# Patient Record
Sex: Male | Born: 1960 | Race: Black or African American | Hispanic: No | Marital: Married | Smoking: Current every day smoker
Health system: Southern US, Community
[De-identification: ages and names within clinical notes are randomized; demographics above are authoritative.]

## PROBLEM LIST (undated history)

## (undated) DIAGNOSIS — I10 Essential (primary) hypertension: Secondary | ICD-10-CM

## (undated) DIAGNOSIS — E213 Hyperparathyroidism, unspecified: Secondary | ICD-10-CM

## (undated) HISTORY — PX: OTHER SURGICAL HISTORY: SHX169

---

## 2005-11-18 ENCOUNTER — Inpatient Hospital Stay (HOSPITAL_COMMUNITY): Admission: AC | Admit: 2005-11-18 | Discharge: 2005-11-18 | Payer: Self-pay

## 2015-12-03 ENCOUNTER — Other Ambulatory Visit (HOSPITAL_COMMUNITY): Payer: Self-pay | Admitting: Family Medicine

## 2015-12-03 DIAGNOSIS — R7989 Other specified abnormal findings of blood chemistry: Secondary | ICD-10-CM

## 2015-12-17 ENCOUNTER — Encounter (HOSPITAL_COMMUNITY)
Admission: RE | Admit: 2015-12-17 | Discharge: 2015-12-17 | Disposition: A | Discharge status: Home / Self Care | Payer: Managed Care, Other (non HMO) | Source: Ambulatory Visit | Attending: Family Medicine | Admitting: Family Medicine

## 2015-12-17 DIAGNOSIS — E215 Disorder of parathyroid gland, unspecified: Secondary | ICD-10-CM | POA: Diagnosis not present

## 2015-12-17 DIAGNOSIS — R7989 Other specified abnormal findings of blood chemistry: Secondary | ICD-10-CM

## 2015-12-17 MED ORDER — TECHNETIUM TC 99M SESTAMIBI GENERIC - CARDIOLITE
25.0000 | Freq: Once | INTRAVENOUS | Status: AC | PRN
  Administered 2015-12-17: 25 via INTRAVENOUS

## 2016-01-13 ENCOUNTER — Ambulatory Visit: Payer: Self-pay | Admitting: Surgery

## 2016-02-02 ENCOUNTER — Encounter: Payer: Self-pay | Admitting: Surgery

## 2016-02-02 DIAGNOSIS — E210 Primary hyperparathyroidism: Secondary | ICD-10-CM | POA: Diagnosis present

## 2016-02-02 NOTE — Patient Instructions (Addendum)
Eluterio E Lantzy  02/02/2016   Your procedure is scheduled on: 02/05/2016  Report to [facility] [address] [facility]  Entrance take [facility]  elevators to 3rd floor to  [place] at 1000AM.  Call this number if you have problems the morning of surgery [phone]   Remember: ONLY 1 PERSON MAY GO WITH YOU TO SHORT STAY TO GET  READY MORNING OF YOUR SURGERY.  Do not eat food or drink liquids :After Midnight.     Take these medicines the morning of surgery with A SIP OF WATER: Clonidine(Catapres), Doxazosin(Cardura), Amlodipine(Norvasc), Pravastatin(Pravachol), metoprolol succinate                                You may not have any metal on your body including hair pins and              piercings  Do not wear jewelry, make-up, lotions, powders or perfumes, deodorant             Do not wear nail polish.  Do not shave  48 hours prior to surgery.              Men may shave face and neck.   Do not bring valuables to the hospital. [place].  Contacts, dentures or bridgework may not be worn into surgery.  Leave suitcase in the car. After surgery it may be brought to your room.     Patients discharged the [place] of surgery will not be allowed to drive home.  Name and phone number of your driver:wife cynthia cell [phone]  Special Instructions: N/A              Please read over the following fact sheets you were given: _____________________________________________________________________             [facility] - Preparing for Surgery Before surgery, you can play an important role.  Because skin is not sterile, your skin needs to be as free of germs as possible.  You can reduce the number of germs on your skin by washing with CHG (chlorahexidine gluconate) soap before surgery.  CHG is an antiseptic cleaner which kills germs and bonds with the skin to continue killing germs even after washing. Please DO NOT use if you have an allergy  to CHG or antibacterial soaps.  If your skin becomes reddened/irritated stop using the CHG and inform your nurse when you arrive at Short Stay. Do not shave (including legs and underarms) for at least 48 hours prior to the first CHG shower.  You may shave your face/neck. Please follow these instructions carefully:  1.  Shower with CHG Soap the night before surgery and the  morning of Surgery.  2.  If you choose to wash your hair, wash your hair first as usual with your  normal  shampoo.  3.  After you shampoo, rinse your hair and body thoroughly to remove the  shampoo.                           4.  Use CHG as you would any other liquid soap.  You can apply chg directly  to the skin and wash  Gently with a scrungie or clean washcloth.  5.  Apply the CHG Soap to your body ONLY FROM THE NECK DOWN.   Do not use on face/ open                           Wound or open sores. Avoid contact with eyes, ears mouth and genitals (private parts).                       Wash face,  Genitals (private parts) with your normal soap.             6.  Wash thoroughly, paying special attention to the area where your surgery  will be performed.  7.  Thoroughly rinse your body with warm water from the neck down.  8.  DO NOT shower/wash with your normal soap after using and rinsing off  the CHG Soap.                9.  Pat yourself dry with a clean towel.            10.  Wear clean pajamas.            11.  Place clean sheets on your bed the night of your first shower and do not  sleep with pets. [place] [facility] : Do not apply any lotions/deodorants the morning of surgery.  Please wear clean clothes to the hospital/surgery center.  FAILURE TO FOLLOW THESE INSTRUCTIONS MAY RESULT IN THE CANCELLATION OF YOUR SURGERY PATIENT SIGNATURE_________________________________  NURSE SIGNATURE__________________________________  ________________________________________________________________________

## 2016-02-02 NOTE — H&P (Signed)
General Surgery [facility]  Vincent Adams DOB: 17-May-1960 Married / Language: English / Race: Black or African American Male  History of Present Illness  The patient is a 56 year old male who presents with a parathyroid neoplasm.  And is referred by Dr. Hulan Fess for evaluation of primary hyperparathyroidism. Patient had been noted over the past few years to have rising serum calcium levels. Most recent levels were elevated at 13.9 with an intact PTH level of 221. Patient denies any complications such as osteoporosis or nephrolithiasis. He does note moderate fatigue. He has had no prior history on the head or neck. There is no family history of endocrine disease and no family history of endocrine neoplasms. Patient underwent nuclear medicine parathyroid scan on December 17, 2015. 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 Patient now presents to discuss surgical resection.   Past Surgical History Colon Polyp Removal - Colonoscopy   Diagnostic Studies History Colonoscopy  5-10 years ago  Allergies  No Known Drug Allergies 01/13/2016  Medication History AmLODIPine Besylate (10MG  Tablet, Oral daily) Active. CloNIDine HCl (0.1MG  Tablet, Oral daily) Active. Doxazosin Mesylate (8MG  Tablet, Oral daily) Active. HydroCHLOROthiazide (25MG  Tablet, Oral daily) Active. Klor-Con M10 (10MEQ Tablet ER, Oral daily) Active. Metoprolol Succinate ER (100MG  Tablet ER 24HR, Oral daily) Active. Potassium Chloride ER (10MEQ Tablet ER, Oral daily) Active. Pravastatin Sodium (40MG  Tablet, Oral daily) Active. Quinapril HCl (40MG  Tablet, Oral daily) Active. Vitamin D (Ergocalciferol) (50000UNIT Capsule, Oral daily) Active. Medications Reconciled  Social History  Alcohol use  Moderate alcohol use. Caffeine use  Carbonated beverages, Tea.  Family History Hypertension  Brother, Father, Mother, Sister.  Other  Problems No pertinent past medical history   Review of Systems General Not Present- Appetite Loss, Chills, Fatigue, Fever, Night Sweats, Weight Gain and Weight Loss. Skin Not Present- Change in Wart/Mole, Dryness, Hives, Jaundice, New Lesions, Non-Healing Wounds, Rash and Ulcer. HEENT Present- Wears glasses/contact lenses. Not Present- Earache, Hearing Loss, Hoarseness, Nose Bleed, Oral Ulcers, Ringing in the Ears, Seasonal Allergies, Sinus Pain, Sore Throat, Visual Disturbances and Yellow Eyes. Respiratory Not Present- Bloody sputum, Chronic Cough, Difficulty Breathing, Snoring and Wheezing. Breast Not Present- Breast Mass, Breast Pain, Nipple Discharge and Skin Changes. Cardiovascular Not Present- Chest Pain, Difficulty Breathing Lying Down, Leg Cramps, Palpitations, Rapid Heart Rate, Shortness of Breath and Swelling of Extremities. Gastrointestinal Not Present- Abdominal Pain, Bloating, Bloody Stool, Change in Bowel Habits, Chronic diarrhea, Constipation, Difficulty Swallowing, Excessive gas, Gets full quickly at meals, Hemorrhoids, Indigestion, Nausea, Rectal Pain and Vomiting. Male Genitourinary Not Present- Blood in Urine, Change in Urinary Stream, Frequency, Impotence, Nocturia, Painful Urination, Urgency and Urine Leakage.  Vitals  Weight: 284.4 lb Height: 72in Body Surface Area: 2.47 m Body Mass Index: 38.57 kg/m  Temp.: 98.67F  Pulse: 89 (Regular)  BP: 124/82 (Sitting, Left Arm, Standard)  Physical Exam The physical exam findings are as follows: Note:General - appears comfortable, no distress; not diaphorectic  HEENT - normocephalic; sclerae clear, gaze conjugate; mucous membranes moist, dentition good; voice normal  Neck - symmetric on extension; no palpable anterior or posterior cervical adenopathy; no palpable masses in the thyroid bed  Chest - clear bilaterally without rhonchi, rales, or wheeze  Cor - regular rhythm with normal rate; no significant  murmur  Ext - non-tender without significant edema or lymphedema  Neuro - grossly intact; no tremor   Assessment & Plan  PRIMARY HYPERPARATHYROIDISM (E21.0)  Pt Education - Pamphlet Given - The  Parathyroid Surgery Book: discussed with patient and provided information. Patient presents on referral from his primary care physician with newly diagnosed primary hyperparathyroidism. Patient is provided with written literature on parathyroid surgery to review at home.  Patient has an elevated intact PTH level of 221 with a markedly elevated calcium level of 13.9. Nuclear medicine parathyroid scan localizes a parathyroid adenoma to the right inferior position.  I recommended proceeding with minimally invasive parathyroidectomy. There is approximately a 95% chance this will be curative. We discussed the potential for a second gland adenoma. That occurs approximately 3-4% of the time. We discussed the procedure, the potential complications, the hospital stay, and his postoperative recovery and return to work. I have recommended 1 week out of work following the procedure. We will monitor his laboratory studies after surgery.  The risks and benefits of the procedure have been discussed at length with the patient. The patient understands the proposed procedure, potential alternative treatments, and the course of recovery to be expected. All of the patient's questions have been answered at this time. The patient wishes to proceed with surgery.  Earnstine Regal, MD, [facility] Office: [phone]

## 2016-02-02 NOTE — Progress Notes (Signed)
11/17/2017Nuclear Medicine Parathyroid with spect in EPIC

## 2016-02-04 ENCOUNTER — Encounter (HOSPITAL_COMMUNITY)
Admission: RE | Admit: 2016-02-04 | Discharge: 2016-02-04 | Disposition: A | Discharge status: Home / Self Care | Payer: Managed Care, Other (non HMO) | Source: Ambulatory Visit | Attending: Surgery | Admitting: Surgery

## 2016-02-04 ENCOUNTER — Encounter (HOSPITAL_COMMUNITY): Payer: Self-pay

## 2016-02-04 DIAGNOSIS — I10 Essential (primary) hypertension: Secondary | ICD-10-CM | POA: Diagnosis not present

## 2016-02-04 DIAGNOSIS — Z7982 Long term (current) use of aspirin: Secondary | ICD-10-CM | POA: Diagnosis not present

## 2016-02-04 DIAGNOSIS — Z79899 Other long term (current) drug therapy: Secondary | ICD-10-CM | POA: Diagnosis not present

## 2016-02-04 DIAGNOSIS — F17200 Nicotine dependence, unspecified, uncomplicated: Secondary | ICD-10-CM | POA: Diagnosis not present

## 2016-02-04 DIAGNOSIS — D351 Benign neoplasm of parathyroid gland: Secondary | ICD-10-CM | POA: Diagnosis not present

## 2016-02-04 DIAGNOSIS — E210 Primary hyperparathyroidism: Secondary | ICD-10-CM | POA: Diagnosis not present

## 2016-02-04 HISTORY — DX: Hyperparathyroidism, unspecified: E21.3

## 2016-02-04 HISTORY — DX: Essential (primary) hypertension: I10

## 2016-02-04 LAB — CBC
HEMATOCRIT: 36.3 % — AB (ref 39.0–52.0)
Hemoglobin: 12.4 g/dL — ABNORMAL LOW (ref 13.0–17.0)
MCH: 30.3 pg (ref 26.0–34.0)
MCHC: 34.2 g/dL (ref 30.0–36.0)
MCV: 88.8 fL (ref 78.0–100.0)
Platelets: 214 10*3/uL (ref 150–400)
RBC: 4.09 MIL/uL — ABNORMAL LOW (ref 4.22–5.81)
RDW: 13.5 % (ref 11.5–15.5)
WBC: 6.7 10*3/uL (ref 4.0–10.5)

## 2016-02-04 LAB — BASIC METABOLIC PANEL
Anion gap: 4 — ABNORMAL LOW (ref 5–15)
BUN: 29 mg/dL — ABNORMAL HIGH (ref 6–20)
CHLORIDE: 111 mmol/L (ref 101–111)
CO2: 24 mmol/L (ref 22–32)
CREATININE: 1.3 mg/dL — AB (ref 0.61–1.24)
Calcium: 13.8 mg/dL (ref 8.9–10.3)
GFR calc non Af Amer: 59 mL/min — ABNORMAL LOW (ref >60)
Glucose, Bld: 133 mg/dL — ABNORMAL HIGH (ref 65–99)
Potassium: 4 mmol/L (ref 3.5–5.1)
Sodium: 139 mmol/L (ref 135–145)

## 2016-02-04 MED ORDER — DEXTROSE 5 % IV SOLN
3.0000 g | INTRAVENOUS | Status: AC
  Administered 2016-02-05: 3 g via INTRAVENOUS
  Filled 2016-02-04: qty 3

## 2016-02-04 NOTE — Progress Notes (Signed)
CRITICAL VALUE ALERT  Critical value received:  Calcium 13.8  Date of notification: 02-04-2016  Time of notification:  951am  Critical value read back: yes  Nurse who received alert:  Zelphia Cairo rn  MD notified (1st page):  Pa on call for [place]  Time of first page:  0955 am  MD notified (2nd page):  Time of second page:  Responding MD:dr gerkin   Time MD responded:  1017 am no new orders received from dr gerkin

## 2016-02-05 ENCOUNTER — Encounter (HOSPITAL_COMMUNITY)
Admission: RE | Disposition: A | Discharge status: Home / Self Care | Payer: Self-pay | Source: Ambulatory Visit | Attending: Surgery

## 2016-02-05 ENCOUNTER — Ambulatory Visit (HOSPITAL_COMMUNITY)
Admission: RE | Admit: 2016-02-05 | Discharge: 2016-02-05 | Disposition: A | Discharge status: Home / Self Care | Payer: Managed Care, Other (non HMO) | Source: Ambulatory Visit | Attending: Surgery | Admitting: Surgery

## 2016-02-05 ENCOUNTER — Ambulatory Visit (HOSPITAL_COMMUNITY): Payer: Managed Care, Other (non HMO) | Admitting: Certified Registered Nurse Anesthetist

## 2016-02-05 ENCOUNTER — Encounter (HOSPITAL_COMMUNITY): Payer: Self-pay | Admitting: *Deleted

## 2016-02-05 DIAGNOSIS — I10 Essential (primary) hypertension: Secondary | ICD-10-CM | POA: Insufficient documentation

## 2016-02-05 DIAGNOSIS — E210 Primary hyperparathyroidism: Secondary | ICD-10-CM | POA: Diagnosis present

## 2016-02-05 DIAGNOSIS — Z79899 Other long term (current) drug therapy: Secondary | ICD-10-CM | POA: Insufficient documentation

## 2016-02-05 DIAGNOSIS — F17200 Nicotine dependence, unspecified, uncomplicated: Secondary | ICD-10-CM | POA: Insufficient documentation

## 2016-02-05 DIAGNOSIS — Z7982 Long term (current) use of aspirin: Secondary | ICD-10-CM | POA: Insufficient documentation

## 2016-02-05 DIAGNOSIS — D351 Benign neoplasm of parathyroid gland: Secondary | ICD-10-CM | POA: Insufficient documentation

## 2016-02-05 HISTORY — PX: PARATHYROIDECTOMY: SHX19

## 2016-02-05 SURGERY — PARATHYROIDECTOMY
Anesthesia: General | Site: Neck | Laterality: Right

## 2016-02-05 MED ORDER — ROCURONIUM BROMIDE 50 MG/5ML IV SOSY
PREFILLED_SYRINGE | INTRAVENOUS | Status: DC | PRN
  Administered 2016-02-05: 10 mg via INTRAVENOUS
  Administered 2016-02-05: 40 mg via INTRAVENOUS

## 2016-02-05 MED ORDER — HYDROCODONE-ACETAMINOPHEN 5-325 MG PO TABS
1.0000 | ORAL_TABLET | ORAL | Status: DC | PRN
  Administered 2016-02-05: 1 via ORAL
  Filled 2016-02-05: qty 1

## 2016-02-05 MED ORDER — FENTANYL CITRATE (PF) 250 MCG/5ML IJ SOLN
INTRAMUSCULAR | Status: AC
  Filled 2016-02-05: qty 5

## 2016-02-05 MED ORDER — MIDAZOLAM HCL 5 MG/5ML IJ SOLN
INTRAMUSCULAR | Status: DC | PRN
  Administered 2016-02-05: 2 mg via INTRAVENOUS

## 2016-02-05 MED ORDER — SUGAMMADEX SODIUM 200 MG/2ML IV SOLN
INTRAVENOUS | Status: DC | PRN
  Administered 2016-02-05: 300 mg via INTRAVENOUS

## 2016-02-05 MED ORDER — MEPERIDINE HCL 50 MG/ML IJ SOLN: 6.2500 mg | INTRAMUSCULAR | Status: DC | PRN

## 2016-02-05 MED ORDER — ONDANSETRON HCL 4 MG/2ML IJ SOLN
INTRAMUSCULAR | Status: DC | PRN
  Administered 2016-02-05: 4 mg via INTRAVENOUS

## 2016-02-05 MED ORDER — SUGAMMADEX SODIUM 500 MG/5ML IV SOLN
INTRAVENOUS | Status: AC
  Filled 2016-02-05: qty 5

## 2016-02-05 MED ORDER — BUPIVACAINE HCL (PF) 0.25 % IJ SOLN
INTRAMUSCULAR | Status: AC
  Filled 2016-02-05: qty 30

## 2016-02-05 MED ORDER — LACTATED RINGERS IV SOLN
INTRAVENOUS | Status: DC
  Administered 2016-02-05: 11:00:00 via INTRAVENOUS

## 2016-02-05 MED ORDER — PROPOFOL 10 MG/ML IV BOLUS
INTRAVENOUS | Status: DC | PRN
  Administered 2016-02-05: 200 mg via INTRAVENOUS

## 2016-02-05 MED ORDER — DEXAMETHASONE SODIUM PHOSPHATE 10 MG/ML IJ SOLN
INTRAMUSCULAR | Status: DC | PRN
  Administered 2016-02-05: 10 mg via INTRAVENOUS

## 2016-02-05 MED ORDER — BUPIVACAINE HCL (PF) 0.25 % IJ SOLN
INTRAMUSCULAR | Status: DC | PRN
  Administered 2016-02-05: 10 mL

## 2016-02-05 MED ORDER — FENTANYL CITRATE (PF) 100 MCG/2ML IJ SOLN
INTRAMUSCULAR | Status: DC | PRN
  Administered 2016-02-05 (×5): 50 ug via INTRAVENOUS

## 2016-02-05 MED ORDER — ROCURONIUM BROMIDE 50 MG/5ML IV SOSY
PREFILLED_SYRINGE | INTRAVENOUS | Status: AC
  Filled 2016-02-05: qty 5

## 2016-02-05 MED ORDER — CHLORHEXIDINE GLUCONATE CLOTH 2 % EX PADS: 6.0000 | MEDICATED_PAD | Freq: Once | CUTANEOUS | Status: DC

## 2016-02-05 MED ORDER — MIDAZOLAM HCL 2 MG/2ML IJ SOLN
INTRAMUSCULAR | Status: AC
  Filled 2016-02-05: qty 2

## 2016-02-05 MED ORDER — PROPOFOL 10 MG/ML IV BOLUS
INTRAVENOUS | Status: AC
  Filled 2016-02-05: qty 20

## 2016-02-05 MED ORDER — SUCCINYLCHOLINE CHLORIDE 200 MG/10ML IV SOSY
PREFILLED_SYRINGE | INTRAVENOUS | Status: AC
  Filled 2016-02-05: qty 10

## 2016-02-05 MED ORDER — ONDANSETRON HCL 4 MG/2ML IJ SOLN
INTRAMUSCULAR | Status: AC
  Filled 2016-02-05: qty 2

## 2016-02-05 MED ORDER — LIDOCAINE HCL 4 % MT SOLN
OROMUCOSAL | Status: DC | PRN
  Administered 2016-02-05: 4 mL via TOPICAL

## 2016-02-05 MED ORDER — FENTANYL CITRATE (PF) 100 MCG/2ML IJ SOLN
INTRAMUSCULAR | Status: AC
  Filled 2016-02-05: qty 2

## 2016-02-05 MED ORDER — LACTATED RINGERS IV SOLN
INTRAVENOUS | Status: DC
  Administered 2016-02-05: 15:00:00 via INTRAVENOUS

## 2016-02-05 MED ORDER — DEXAMETHASONE SODIUM PHOSPHATE 10 MG/ML IJ SOLN
INTRAMUSCULAR | Status: AC
  Filled 2016-02-05: qty 1

## 2016-02-05 MED ORDER — METOCLOPRAMIDE HCL 5 MG/ML IJ SOLN: 10.0000 mg | Freq: Once | INTRAMUSCULAR | Status: DC | PRN

## 2016-02-05 MED ORDER — SUCCINYLCHOLINE CHLORIDE 200 MG/10ML IV SOSY
PREFILLED_SYRINGE | INTRAVENOUS | Status: DC | PRN
  Administered 2016-02-05: 100 mg via INTRAVENOUS

## 2016-02-05 MED ORDER — HYDROCODONE-ACETAMINOPHEN 5-325 MG PO TABS: 1.0000 | ORAL_TABLET | ORAL | 0 refills | Status: AC | PRN

## 2016-02-05 MED ORDER — LIDOCAINE 2% (20 MG/ML) 5 ML SYRINGE
INTRAMUSCULAR | Status: AC
  Filled 2016-02-05: qty 5

## 2016-02-05 MED ORDER — 0.9 % SODIUM CHLORIDE (POUR BTL) OPTIME
TOPICAL | Status: DC | PRN
  Administered 2016-02-05: 1000 mL

## 2016-02-05 MED ORDER — FENTANYL CITRATE (PF) 100 MCG/2ML IJ SOLN
25.0000 ug | INTRAMUSCULAR | Status: DC | PRN
  Administered 2016-02-05 (×2): 50 ug via INTRAVENOUS

## 2016-02-05 SURGICAL SUPPLY — 42 items
ADH SKN CLS APL DERMABOND .7 (GAUZE/BANDAGES/DRESSINGS)
APL SKNCLS STERI-STRIP NONHPOA (GAUZE/BANDAGES/DRESSINGS)
ATTRACTOMAT 16X20 MAGNETIC DRP (DRAPES) ×2 IMPLANT
BENZOIN TINCTURE PRP APPL 2/3 (GAUZE/BANDAGES/DRESSINGS) IMPLANT
BLADE HEX COATED 2.75 (ELECTRODE) ×2 IMPLANT
BLADE SURG 15 STRL LF DISP TIS (BLADE) ×1 IMPLANT
BLADE SURG 15 STRL SS (BLADE) ×2
CHLORAPREP W/TINT 26ML (MISCELLANEOUS) ×3 IMPLANT
CLIP TI MEDIUM 6 (CLIP) ×4 IMPLANT
CLIP TI WIDE RED SMALL 6 (CLIP) ×5 IMPLANT
COVER SURGICAL LIGHT HANDLE (MISCELLANEOUS) ×2 IMPLANT
DERMABOND ADVANCED (GAUZE/BANDAGES/DRESSINGS)
DERMABOND ADVANCED .7 DNX12 (GAUZE/BANDAGES/DRESSINGS) IMPLANT
DRAPE LAPAROTOMY T 98X78 PEDS (DRAPES) ×2 IMPLANT
ELECT PENCIL ROCKER SW 15FT (MISCELLANEOUS) ×2 IMPLANT
ELECT REM PT RETURN 9FT ADLT (ELECTROSURGICAL) ×2
ELECTRODE REM PT RTRN 9FT ADLT (ELECTROSURGICAL) ×1 IMPLANT
GAUZE SPONGE 4X4 12PLY STRL (GAUZE/BANDAGES/DRESSINGS) ×1 IMPLANT
GAUZE SPONGE 4X4 16PLY XRAY LF (GAUZE/BANDAGES/DRESSINGS) ×2 IMPLANT
GLOVE SURG ORTHO 8.0 STRL STRW (GLOVE) ×2 IMPLANT
GOWN STRL REUS W/TWL XL LVL3 (GOWN DISPOSABLE) ×6 IMPLANT
HEMOSTAT SURGICEL 2X4 FIBR (HEMOSTASIS) ×2 IMPLANT
ILLUMINATOR WAVEGUIDE N/F (MISCELLANEOUS) IMPLANT
KIT BASIN OR (CUSTOM PROCEDURE TRAY) ×2 IMPLANT
LIGHT WAVEGUIDE WIDE FLAT (MISCELLANEOUS) IMPLANT
NDL HYPO 25X1 1.5 SAFETY (NEEDLE) ×1 IMPLANT
NEEDLE HYPO 25X1 1.5 SAFETY (NEEDLE) ×2 IMPLANT
PACK BASIC VI WITH GOWN DISP (CUSTOM PROCEDURE TRAY) ×2 IMPLANT
STAPLER VISISTAT 35W (STAPLE) ×1 IMPLANT
STRIP CLOSURE SKIN 1/2X4 (GAUZE/BANDAGES/DRESSINGS) ×1 IMPLANT
SUT MNCRL AB 4-0 PS2 18 (SUTURE) ×2 IMPLANT
SUT SILK 2 0 (SUTURE)
SUT SILK 2-0 18XBRD TIE 12 (SUTURE) IMPLANT
SUT SILK 3 0 (SUTURE)
SUT SILK 3-0 18XBRD TIE 12 (SUTURE) IMPLANT
SUT VIC AB 3-0 SH 18 (SUTURE) ×2 IMPLANT
SYR BULB IRRIGATION 50ML (SYRINGE) ×2 IMPLANT
SYR CONTROL 10ML LL (SYRINGE) ×2 IMPLANT
TAPE CLOTH SURG 4X10 WHT LF (GAUZE/BANDAGES/DRESSINGS) ×1 IMPLANT
TOWEL OR 17X26 10 PK STRL BLUE (TOWEL DISPOSABLE) ×2 IMPLANT
TOWEL OR NON WOVEN STRL DISP B (DISPOSABLE) ×2 IMPLANT
YANKAUER SUCT BULB TIP 10FT TU (MISCELLANEOUS) ×2 IMPLANT

## 2016-02-05 NOTE — Op Note (Signed)
OPERATIVE REPORT - PARATHYROIDECTOMY  Preoperative diagnosis: Primary hyperparathyroidism  Postop diagnosis: Same  Procedure: Right minimally invasive parathyroidectomy  Surgeon:  Earnstine Regal, MD, FACS  Anesthesia: Gen. endotracheal  Estimated blood loss: Minimal  Preparation: ChloraPrep  Indications: The patient is a 56 year old male who is referred by Dr. Hulan Fess for evaluation of primary hyperparathyroidism. Patient had been noted over the past few years to have rising serum calcium levels. Most recent levels were elevated at 13.9 with an intact PTH level of 221. Patient denies any complications such as osteoporosis or nephrolithiasis. He does note moderate fatigue. He has had no prior history on the head or neck. There is no family history of endocrine disease and no family history of endocrine neoplasms. Patient underwent nuclear medicine parathyroid scan on December 17, 2015. 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 Patient now presents to discuss surgical resection.  Procedure: Patient was prepared in the holding area. He was brought to operating room and placed in a supine position on the operating room table. Following administration of general anesthesia, the patient was positioned and then prepped and draped in the usual strict aseptic fashion. After ascertaining that an adequate level of anesthesia been achieved, a neck incision was made with a #15 blade. Dissection was carried through subcutaneous tissues and platysma. Hemostasis was obtained with the electrocautery. Skin flaps were developed circumferentially and a Weitlander retractor was placed for exposure.  Strap muscles were incised in the midline. Strap muscles were reflected exposing the thyroid lobe. With gentle blunt dissection the thyroid lobe was mobilized.  Dissection was carried through adipose tissue and a markedly enlarged parathyroid gland was identified. It  was gently mobilized. It is located relatively deep in the right neck posterior to the right thyroid lobe and extending to the pre-cervical fascia.  Vascular structures were divided between small and medium ligaclips. Care was taken to avoid the recurrent laryngeal nerve and the esophagus. The parathyroid gland was completely excised. It was submitted to pathology where frozen section confirmed parathyroid tissue consistent with adenoma.  Neck was irrigated with warm saline and good hemostasis was noted. Fibrillar was placed in the operative field. Strap muscles were reapproximated in the midline with interrupted 3-0 Vicryl sutures. Platysma was closed with interrupted 3-0 Vicryl sutures. Skin was closed with a running 4-0 Monocryl subcuticular suture. Marcaine was infiltrated circumferentially. Wound was washed and dried and Steri-Strips were applied. Sterile gauze dressings were applied. Patient was awakened from anesthesia and brought to the recovery room. The patient tolerated the procedure well.   Earnstine Regal, MD, [facility]

## 2016-02-05 NOTE — Anesthesia Preprocedure Evaluation (Signed)
Anesthesia Evaluation  Patient identified by MRN, date of birth, ID band Patient awake    Reviewed: Allergy & Precautions, NPO status , Patient's Chart, lab work & pertinent test results  Airway Mallampati: II  TM Distance: >3 FB Neck ROM: Full    Dental no notable dental hx.    Pulmonary Current Smoker,    Pulmonary exam normal breath sounds clear to auscultation       Cardiovascular hypertension, Pt. on medications Normal cardiovascular exam Rhythm:Regular Rate:Normal     Neuro/Psych negative neurological ROS  negative psych ROS   GI/Hepatic negative GI ROS, Neg liver ROS,   Endo/Other  negative endocrine ROS  Renal/GU negative Renal ROS  negative genitourinary   Musculoskeletal negative musculoskeletal ROS (+)   Abdominal   Peds negative pediatric ROS (+)  Hematology negative hematology ROS (+)   Anesthesia Other Findings   Reproductive/Obstetrics negative OB ROS                             Anesthesia Physical Anesthesia Plan  ASA: II  Anesthesia Plan: General   Post-op Pain Management:    Induction: Intravenous  Airway Management Planned: Oral ETT  Additional Equipment:   Intra-op Plan:   Post-operative Plan: Extubation in OR  Informed Consent: I have reviewed the patients History and Physical, chart, labs and discussed the procedure including the risks, benefits and alternatives for the proposed anesthesia with the patient or authorized representative who has indicated his/her understanding and acceptance.   Dental advisory given  Plan Discussed with: CRNA  Anesthesia Plan Comments:        Anesthesia Quick Evaluation

## 2016-02-05 NOTE — Anesthesia Postprocedure Evaluation (Signed)
Anesthesia Post Note  Patient: Vincent Adams  Procedure(s) Performed: Procedure(s) (LRB): RIGHT PARATHYROIDECTOMY (Right)  Patient location during evaluation: PACU Anesthesia Type: General Level of consciousness: awake and alert Pain management: pain level controlled Vital Signs Assessment: post-procedure vital signs reviewed and stable Respiratory status: spontaneous breathing, nonlabored ventilation, respiratory function stable and patient connected to nasal cannula oxygen Cardiovascular status: blood pressure returned to baseline and stable Postop Assessment: no signs of nausea or vomiting Anesthetic complications: no       Last Vitals:  Vitals:   02/05/16 1512 02/05/16 1604  BP: (!) 150/91 (!) 151/103  Pulse: 70 88  Resp: 16 16  Temp: 36.8 C 36.9 C    Last Pain:  Vitals:   02/05/16 1604  PainSc: 3                  Montez Hageman

## 2016-02-05 NOTE — Transfer of Care (Signed)
Immediate Anesthesia Transfer of Care Note  Patient: Vincent Adams  Procedure(s) Performed: Procedure(s): RIGHT PARATHYROIDECTOMY (Right)  Patient Location: PACU  Anesthesia Type:General  Level of Consciousness:  sedated, patient cooperative and responds to stimulation  Airway & Oxygen Therapy:Patient Spontanous Breathing and Patient connected to face mask oxgen  Post-op Assessment:  Report given to PACU RN and Post -op Vital signs reviewed and stable  Post vital signs:  Reviewed and stable  Last Vitals:  Vitals:   02/05/16 0959  BP: 127/89  Pulse: 80  Resp: 18  Temp: 0000000 C    Complications: No apparent anesthesia complications

## 2016-02-05 NOTE — Discharge Instructions (Signed)

## 2016-02-05 NOTE — Anesthesia Procedure Notes (Signed)
Procedure Name: Intubation Date/Time: 02/05/2016 12:25 PM Performed by: Maxwell Caul Pre-anesthesia Checklist: Patient identified, Emergency Drugs available, Suction available and Patient being monitored Patient Re-evaluated:Patient Re-evaluated prior to inductionOxygen Delivery Method: Circle system utilized Preoxygenation: Pre-oxygenation with 100% oxygen Intubation Type: IV induction Ventilation: Mask ventilation without difficulty Laryngoscope Size: Mac and 4 Grade View: Grade I Tube type: Oral Tube size: 7.5 mm Number of attempts: 1 Airway Equipment and Method: Stylet and LTA kit utilized Placement Confirmation: ETT inserted through vocal cords under direct vision,  positive ETCO2 and breath sounds checked- equal and bilateral Secured at: 21 cm Tube secured with: Tape Dental Injury: Teeth and Oropharynx as per pre-operative assessment

## 2016-02-05 NOTE — Interval H&P Note (Signed)
History and Physical Interval Note:  02/05/2016 12:09 PM  Vincent Adams  has presented today for surgery, with the diagnosis of PRIMARY HYPERPARATHYROIDISM  The various methods of treatment have been discussed with the patient and family. After consideration of risks, benefits and other options for treatment, the patient has consented to    Procedure(s): RIGHT INFERIOR PARATHYROIDECTOMY (Right) as a surgical intervention .    The patient's history has been reviewed, patient examined, no change in status, stable for surgery.  I have reviewed the patient's chart and labs.  Questions were answered to the patient's satisfaction.    Earnstine Regal, MD, [facility] Office: [place]

## 2016-02-08 ENCOUNTER — Encounter (HOSPITAL_COMMUNITY): Payer: Self-pay | Admitting: Surgery

## 2016-09-05 ENCOUNTER — Other Ambulatory Visit: Payer: Self-pay | Admitting: Family Medicine

## 2016-09-05 DIAGNOSIS — M5416 Radiculopathy, lumbar region: Secondary | ICD-10-CM

## 2016-09-17 ENCOUNTER — Ambulatory Visit
Admission: RE | Admit: 2016-09-17 | Discharge: 2016-09-17 | Disposition: A | Discharge status: Home / Self Care | Payer: Managed Care, Other (non HMO) | Source: Ambulatory Visit | Attending: Family Medicine | Admitting: Family Medicine

## 2016-09-17 DIAGNOSIS — M5416 Radiculopathy, lumbar region: Secondary | ICD-10-CM

## 2019-05-03 ENCOUNTER — Ambulatory Visit: Payer: Managed Care, Other (non HMO) | Attending: Internal Medicine

## 2019-05-03 DIAGNOSIS — Z23 Encounter for immunization: Secondary | ICD-10-CM

## 2019-05-03 NOTE — Progress Notes (Signed)
   Covid-19 Vaccination Clinic  Name:  Vincent Adams    MRN: [id number] DOB: 1961-01-04  05/03/2019  Mr. Vincent Adams was observed post Covid-19 immunization for 15 minutes without incident. He was provided with Vaccine Information Sheet and instruction to access the V-Safe system.   Mr. Vincent Adams was instructed to call 911 with any severe reactions post vaccine: Marland Kitchen Difficulty breathing  . Swelling of face and throat  . A fast heartbeat  . A bad rash all over body  . Dizziness and weakness   Immunizations Administered    Name Date Dose VIS Date Route   Pfizer COVID-19 Vaccine 05/03/2019 10:30 AM 0.3 mL 01/11/2019 Intramuscular   Manufacturer: [employer], [employer]   Lot: [id number]   [place]: [id number]

## 2019-05-29 ENCOUNTER — Ambulatory Visit: Payer: Managed Care, Other (non HMO) | Attending: Internal Medicine

## 2019-05-29 DIAGNOSIS — Z23 Encounter for immunization: Secondary | ICD-10-CM

## 2019-05-29 NOTE — Progress Notes (Signed)
   Covid-19 Vaccination Clinic  Name:  Vincent Adams    MRN: [id number] DOB: Apr 07, 1960  05/29/2019  Mr. Ferrari was observed post Covid-19 immunization for 15 minutes without incident. He was provided with Vaccine Information Sheet and instruction to access the V-Safe system.   Mr. Hurd was instructed to call 911 with any severe reactions post vaccine: Marland Kitchen Difficulty breathing  . Swelling of face and throat  . A fast heartbeat  . A bad rash all over body  . Dizziness and weakness   Immunizations Administered    Name Date Dose VIS Date Route   Pfizer COVID-19 Vaccine 05/29/2019 10:05 AM 0.3 mL 03/27/2018 Intramuscular   Manufacturer: [place]   Lot: [id number]   [place]: [id number]

## 2020-01-18 ENCOUNTER — Ambulatory Visit: Payer: Managed Care, Other (non HMO)

## 2020-10-25 ENCOUNTER — Emergency Department (HOSPITAL_COMMUNITY): Payer: Managed Care, Other (non HMO)

## 2020-10-25 ENCOUNTER — Other Ambulatory Visit: Payer: Self-pay

## 2020-10-25 ENCOUNTER — Encounter (HOSPITAL_COMMUNITY): Payer: Self-pay | Admitting: Emergency Medicine

## 2020-10-25 ENCOUNTER — Emergency Department (HOSPITAL_COMMUNITY)
Admission: EM | Admit: 2020-10-25 | Discharge: 2020-10-25 | Disposition: A | Discharge status: Home / Self Care | Payer: Managed Care, Other (non HMO) | Attending: Emergency Medicine | Admitting: Emergency Medicine

## 2020-10-25 DIAGNOSIS — Z79899 Other long term (current) drug therapy: Secondary | ICD-10-CM | POA: Diagnosis not present

## 2020-10-25 DIAGNOSIS — L03115 Cellulitis of right lower limb: Secondary | ICD-10-CM

## 2020-10-25 DIAGNOSIS — Z7982 Long term (current) use of aspirin: Secondary | ICD-10-CM | POA: Insufficient documentation

## 2020-10-25 DIAGNOSIS — R0602 Shortness of breath: Secondary | ICD-10-CM | POA: Insufficient documentation

## 2020-10-25 DIAGNOSIS — F17210 Nicotine dependence, cigarettes, uncomplicated: Secondary | ICD-10-CM | POA: Insufficient documentation

## 2020-10-25 DIAGNOSIS — R600 Localized edema: Secondary | ICD-10-CM | POA: Diagnosis not present

## 2020-10-25 DIAGNOSIS — I10 Essential (primary) hypertension: Secondary | ICD-10-CM | POA: Insufficient documentation

## 2020-10-25 DIAGNOSIS — R609 Edema, unspecified: Secondary | ICD-10-CM | POA: Diagnosis not present

## 2020-10-25 DIAGNOSIS — M79661 Pain in right lower leg: Secondary | ICD-10-CM | POA: Diagnosis present

## 2020-10-25 DIAGNOSIS — M7989 Other specified soft tissue disorders: Secondary | ICD-10-CM | POA: Insufficient documentation

## 2020-10-25 LAB — CBC WITH DIFFERENTIAL/PLATELET
Abs Immature Granulocytes: 0.17 10*3/uL — ABNORMAL HIGH (ref 0.00–0.07)
Basophils Absolute: 0.1 10*3/uL (ref 0.0–0.1)
Basophils Relative: 0 %
Eosinophils Absolute: 0 10*3/uL (ref 0.0–0.5)
Eosinophils Relative: 0 %
HCT: 37.5 % — ABNORMAL LOW (ref 39.0–52.0)
Hemoglobin: 12.7 g/dL — ABNORMAL LOW (ref 13.0–17.0)
Immature Granulocytes: 1 %
Lymphocytes Relative: 2 %
Lymphs Abs: 0.3 10*3/uL — ABNORMAL LOW (ref 0.7–4.0)
MCH: 30.5 pg (ref 26.0–34.0)
MCHC: 33.9 g/dL (ref 30.0–36.0)
MCV: 89.9 fL (ref 80.0–100.0)
Monocytes Absolute: 0.9 10*3/uL (ref 0.1–1.0)
Monocytes Relative: 5 %
Neutro Abs: 14.8 10*3/uL — ABNORMAL HIGH (ref 1.7–7.7)
Neutrophils Relative %: 92 %
Platelets: 166 10*3/uL (ref 150–400)
RBC: 4.17 MIL/uL — ABNORMAL LOW (ref 4.22–5.81)
RDW: 13.4 % (ref 11.5–15.5)
WBC: 16.2 10*3/uL — ABNORMAL HIGH (ref 4.0–10.5)
nRBC: 0 % (ref 0.0–0.2)

## 2020-10-25 LAB — BASIC METABOLIC PANEL
Anion gap: 13 (ref 5–15)
BUN: 14 mg/dL (ref 8–23)
CO2: 22 mmol/L (ref 22–32)
Calcium: 8.5 mg/dL — ABNORMAL LOW (ref 8.9–10.3)
Chloride: 101 mmol/L (ref 98–111)
Creatinine, Ser: 1.77 mg/dL — ABNORMAL HIGH (ref 0.61–1.24)
GFR, Estimated: 43 mL/min — ABNORMAL LOW (ref >60)
Glucose, Bld: 196 mg/dL — ABNORMAL HIGH (ref 70–99)
Potassium: 2.9 mmol/L — ABNORMAL LOW (ref 3.5–5.1)
Sodium: 136 mmol/L (ref 135–145)

## 2020-10-25 MED ORDER — CEPHALEXIN 500 MG PO CAPS: 500.0000 mg | ORAL_CAPSULE | Freq: Four times a day (QID) | ORAL | 0 refills | Status: AC

## 2020-10-25 MED ORDER — IOHEXOL 350 MG/ML SOLN
100.0000 mL | Freq: Once | INTRAVENOUS | Status: AC | PRN
  Administered 2020-10-25: 100 mL via INTRAVENOUS

## 2020-10-25 MED ORDER — MORPHINE SULFATE (PF) 4 MG/ML IV SOLN
4.0000 mg | Freq: Once | INTRAVENOUS | Status: AC
  Administered 2020-10-25: 4 mg via INTRAVENOUS
  Filled 2020-10-25: qty 1

## 2020-10-25 MED ORDER — SODIUM CHLORIDE 0.9 % IV BOLUS
500.0000 mL | Freq: Once | INTRAVENOUS | Status: AC
  Administered 2020-10-25: 500 mL via INTRAVENOUS

## 2020-10-25 MED ORDER — POTASSIUM CHLORIDE CRYS ER 20 MEQ PO TBCR
40.0000 meq | EXTENDED_RELEASE_TABLET | Freq: Once | ORAL | Status: AC
Start: 2020-10-25 — End: 2020-10-25
  Administered 2020-10-25: 40 meq via ORAL
  Filled 2020-10-25: qty 2

## 2020-10-25 MED ORDER — ONDANSETRON HCL 4 MG/2ML IJ SOLN
4.0000 mg | Freq: Once | INTRAMUSCULAR | Status: AC
  Administered 2020-10-25: 4 mg via INTRAVENOUS
  Filled 2020-10-25: qty 2

## 2020-10-25 MED ORDER — ACETAMINOPHEN 500 MG PO TABS
1000.0000 mg | ORAL_TABLET | Freq: Once | ORAL | Status: AC
  Administered 2020-10-25: 1000 mg via ORAL
  Filled 2020-10-25: qty 2

## 2020-10-25 NOTE — ED Triage Notes (Signed)
Pt reports increased pain and swelling to RLE x 2 days.  Denies chest pain or SOB.

## 2020-10-25 NOTE — Discharge Instructions (Addendum)
Recheck with your doctor this week.  Return to the emergency room for new or worsening symptoms.  I have prescribed you antibiotics for cellulitis.  Please take as directed.  Please drink plenty of water take antibiotics with food.  Please use Tylenol or ibuprofen for pain.  You may use 600 mg ibuprofen every 6 hours or 1000 mg of Tylenol every 6 hours.  You may choose to alternate between the 2.  This would be most effective.  Not to exceed 4 g of Tylenol within 24 hours.  Not to exceed 3200 mg ibuprofen 24 hours.

## 2020-10-25 NOTE — Progress Notes (Signed)
VASCULAR LAB    Right lower extremity venous duplex has been performed.  See CV proc for preliminary results.  Messaged results to Suella Broad, PA-C  Casson Catena, RVT 10/25/2020, 1:54 PM

## 2020-10-25 NOTE — ED Notes (Signed)
Patient transported to US 

## 2020-10-25 NOTE — ED Provider Notes (Signed)
Accepted handoff at shift change from Suella Broad PA-C. Please see prior provider note for more detail.   Briefly: "60 year old male with history of hyperparathyroidism and hypertension presents with complaint of pain in his right lower leg x2 days. Patient did some work on his house 2 days ago, that night woke up in a sweat and has had mild SHOB and pain in his right calf since that time (history provided by patient's wife at bedside, patient's only complaint is calf pain)."   PLAN: Concern for PE, DVT, cellulitis DVT study was negative   Physical Exam  BP 125/78 (BP Location: Right Arm)   Pulse 95   Temp 99.2 F (37.3 C) (Oral)   Resp 18   Ht 6' (1.829 m)   Wt 129.3 kg   SpO2 100%   BMI 38.65 kg/m   Physical Exam  ED Course/Procedures   Clinical Course as of 10/26/20 1631  Sun Oct 26, 8935  3625 60 year old male with complaint of pain in his right calf with slightly worse than baseline swelling. Wife reports sweat 2 nights ago, slight SHOB. No orthopnea.  [place] negative for DVT. CTA ordered to evaluate for PE due to leg swelling, SHOB, mildly tachycardic on arrival at 113. BMP with hypokalemia at 2.9, given oral replacement. Also Cr 1.77, not significantly changed from baseline, GFR 43, given IV fluids as he was given contrast with his chest CT. CBC with WBC elevated at 16.2 with increase in neutrophils.  [LM]  1502 Care signed out at change of shift pending CTA to evaluate for PE.  If negative, consider Keflex for possible cellulitis right lower leg. [LM]    Clinical Course User Index [LM] Tacy Learn, PA-C    Procedures Results for orders placed or performed during the hospital encounter of 19/41/74  Basic metabolic panel  Result Value Ref Range   Sodium 136 135 - 145 mmol/L   Potassium 2.9 (L) 3.5 - 5.1 mmol/L   Chloride 101 98 - 111 mmol/L   CO2 22 22 - 32 mmol/L   Glucose, Bld 196 (H) 70 - 99 mg/dL   BUN 14 8 - 23 mg/dL   Creatinine, Ser 1.77 (H) 0.61 - 1.24  mg/dL   Calcium 8.5 (L) 8.9 - 10.3 mg/dL   GFR, Estimated 43 (L) >60 mL/min   Anion gap 13 5 - 15  CBC with Differential  Result Value Ref Range   WBC 16.2 (H) 4.0 - 10.5 K/uL   RBC 4.17 (L) 4.22 - 5.81 MIL/uL   Hemoglobin 12.7 (L) 13.0 - 17.0 g/dL   HCT 37.5 (L) 39.0 - 52.0 %   MCV 89.9 80.0 - 100.0 fL   MCH 30.5 26.0 - 34.0 pg   MCHC 33.9 30.0 - 36.0 g/dL   RDW 13.4 11.5 - 15.5 %   Platelets 166 150 - 400 K/uL   nRBC 0.0 0.0 - 0.2 %   Neutrophils Relative % 92 %   Neutro Abs 14.8 (H) 1.7 - 7.7 K/uL   Lymphocytes Relative 2 %   Lymphs Abs 0.3 (L) 0.7 - 4.0 K/uL   Monocytes Relative 5 %   Monocytes Absolute 0.9 0.1 - 1.0 K/uL   Eosinophils Relative 0 %   Eosinophils Absolute 0.0 0.0 - 0.5 K/uL   Basophils Relative 0 %   Basophils Absolute 0.1 0.0 - 0.1 K/uL   Immature Granulocytes 1 %   Abs Immature Granulocytes 0.17 (H) 0.00 - 0.07 K/uL   DG Chest  2 View  Result Date: 10/25/2020 CLINICAL DATA:  sob EXAM: CHEST - 2 VIEW COMPARISON:  November 18, 2005 FINDINGS: Evaluation is limited secondary to patient rotation. The cardiomediastinal silhouette is unchanged in contour. No pleural effusion. No pneumothorax. No acute pleuroparenchymal abnormality. Visualized abdomen is unremarkable. Multilevel degenerative changes of the thoracic spine. IMPRESSION: No acute cardiopulmonary abnormality. Electronically Signed   By: Valentino Saxon M.D.   On: 10/25/2020 12:28   CT Angio Chest PE W/Cm &/Or Wo Cm  Result Date: 10/25/2020 CLINICAL DATA:  Pt reports increased pain and swelling to RLE x 2 days. Denies chest pain or SOB. PE suspected, high prob EXAM: CT ANGIOGRAPHY CHEST WITH CONTRAST TECHNIQUE: Multidetector CT imaging of the chest was performed using the standard protocol during bolus administration of intravenous contrast. Multiplanar CT image reconstructions and MIPs were obtained to evaluate the vascular anatomy. CONTRAST:  165mL OMNIPAQUE IOHEXOL 350 MG/ML SOLN COMPARISON:  Chest  x-ray 11/18/2005, CT chest 11/18/2005, NM parathyroid scintigraphy 12/17/2015 FINDINGS: Cardiovascular: Satisfactory opacification of the pulmonary arteries to the segmental level. No evidence of pulmonary embolism. The main pulmonary artery measures at the upper limits of normal. Normal heart size. No significant pericardial effusion. The thoracic aorta is normal in caliber. No atherosclerotic plaque of the thoracic aorta. At least 2 vessel coronary artery calcifications. Mediastinum/Nodes: No enlarged mediastinal, hilar, or axillary lymph nodes. Incidentally noted asymmetrically enlarged left thyroid gland compared to the right. Not clinically significant; no follow-up imaging recommended (ref: J Am Coll Radiol. 2015 Feb;12(2): 143-50). Trachea and esophagus demonstrate no significant findings. Tiny hiatal hernia. Lungs/Pleura: Bilateral lower lobe subsegmental atelectasis. No focal consolidation. No pulmonary nodule. No pulmonary mass. No pleural effusion. No pneumothorax. Upper Abdomen: Fluid density lesions within the right kidney likely represent simple renal cysts. Similar findings of the left kidney partially visualized. Subcentimeter hypodensities within the liver too small to characterize. Musculoskeletal: No chest wall abnormality. No suspicious lytic or blastic osseous lesions. No acute displaced fracture. Multilevel degenerative changes of the spine. Review of the MIP images confirms the above findings. IMPRESSION: 1. No pulmonary embolus. 2. The main pulmonary artery measures at the upper limits of normal. Findings may represent pulmonary hypertension. 3. Tiny hiatal hernia. 4. At least two vessel coronary artery calcifications. 5. Cystic lesions of kidneys likely representing simple renal cysts. Subcentimeter hypodensities within the liver that are too small to characterize. Electronically Signed   By: Iven Finn M.D.   On: 10/25/2020 15:24   VAS [place] LOWER EXTREMITY VENOUS (DVT) (ONLY [facility] & [facility]  7a-7p)  Result Date: 10/25/2020  Lower Venous DVT Study Patient Name:  Nnaemeka ERIC Goodpasture  Date of Exam:   10/25/2020 Medical Rec #: [id number]       Accession #:    [id number] Date of Birth: [birth year]      Patient Gender: M Patient Age:   52 years Exam Location:  [facility] Procedure:      VAS [place] LOWER EXTREMITY VENOUS (DVT) Referring Phys: HINA KHATRI --------------------------------------------------------------------------------  Limitations: Body habitus and severe edema of the calf. Comparison Study: No prior study Performing Technologist: Sharion Dove RVS  Examination Guidelines: A complete evaluation includes B-mode imaging, spectral Doppler, color Doppler, and power Doppler as needed of all accessible portions of each vessel. Bilateral testing is considered an integral part of a complete examination. Limited examinations for reoccurring indications may be performed as noted. The reflux portion of the exam is performed with the patient in reverse Trendelenburg.  +---------+---------------+---------+-----------+----------+-------------------+ RIGHT  CompressibilityPhasicitySpontaneityPropertiesThrombus Aging      +---------+---------------+---------+-----------+----------+-------------------+ CFV                     Yes      Yes                                      +---------+---------------+---------+-----------+----------+-------------------+ FV Prox  Full                                                             +---------+---------------+---------+-----------+----------+-------------------+ FV Mid   Full                                                             +---------+---------------+---------+-----------+----------+-------------------+ FV DistalFull                                                             +---------+---------------+---------+-----------+----------+-------------------+ PFV      Full                                                              +---------+---------------+---------+-----------+----------+-------------------+ POP      Full           Yes      Yes                                      +---------+---------------+---------+-----------+----------+-------------------+ PTV                                                   patent by color     +---------+---------------+---------+-----------+----------+-------------------+ PERO                                                  Not well visualized +---------+---------------+---------+-----------+----------+-------------------+   Left Technical Findings: Left leg not evaluated.   Summary: RIGHT: - There is no evidence of deep vein thrombosis in the lower extremity. However, portions of this examination were limited- see technologist comments above.   *See table(s) above for measurements and observations.    Preliminary      MDM  Patient is a 60 year old male presented to the ER today with CTA pending as well as other work-up.  If negative plan will be to treat right lower leg cellulitis.  On my evaluation patient's right lower extremity does appear cellulitic  Return precautions given to patient.  Lab work reviewed by Percell Miller agree with prior provider's plan and reasoning.  Patient feels much improved and is no longer tachycardic after Tylenol fluids and analgesia here in the ER.  Will discharge home with antibiotics.  Return precautions given.  Follow-up with PCP expediently.  States he can be seen within the week.       Pati Gallo [place], [place] 10/26/20 1634    Davonna Belling, MD 10/28/20 0004

## 2020-10-25 NOTE — ED Provider Notes (Signed)
Emergency Medicine Provider Triage Evaluation Note  Jakwon Gayton Ofallon , a 60 y.o. male  was evaluated in triage.  Pt complains of right leg.  He has had swelling due to "poor circulation" in his right lower extremity for about 5 years.  For the past 2 days he has noticed pain in the posterior part of his right lower leg without specific trigger.  He is concerned that he may have a blood clot versus infection. Denies any chest pain or SOB but wife reports SOB.  No history of blood clots, no fever.  Review of Systems  Positive: Leg swelling Negative: Fever, chest pain  Physical Exam  BP 124/81 (BP Location: Right Arm)   Pulse (!) 113   Temp 99.6 F (37.6 C) (Oral)   Resp 16   SpO2 97%  Gen:   Awake, no distress   Resp:  Normal effort  MSK:   Moves extremities without difficulty  Other:  2+ DP pulse palpated on right lower extremity.  There is edema of right lower extremity and significant compared to the left with some discoloration  Medical Decision Making  Medically screening exam initiated at 11:55 AM.  Appropriate orders placed.  Narciso Randall Hiss Redford was informed that the remainder of the evaluation will be completed by another provider, this initial triage assessment does not replace that evaluation, and the importance of remaining in the ED until their evaluation is complete.  Ultrasound ordered, lab work   Delia Heady, Hershal Coria 10/25/20 [place]    Fredia Sorrow, MD 11/02/20 1317

## 2020-10-25 NOTE — ED Provider Notes (Signed)
[facility] EMERGENCY DEPARTMENT Provider Note   CSN: [id number] Arrival date & time: 10/25/20  1133     History No chief complaint on file.   Vincent Adams is a 60 y.o. male.  60 year old male with history of hyperparathyroidism and hypertension presents with complaint of pain in his right lower leg x2 days. Patient did some work on his house 2 days ago, that night woke up in a sweat and has had mild SHOB and pain in his right calf since that time (history provided by patient's wife at bedside, patient's only complaint is calf pain).       Past Medical History:  Diagnosis Date   Hyperparathyroidism [facility])    Hypertension     Patient Active Problem List   Diagnosis Date Noted   Hyperparathyroidism, primary ([place]) 02/02/2016    Past Surgical History:  Procedure Laterality Date   colonscopy  few yrs ago   left small finger pinning  few yrs back   pin later rmoved   PARATHYROIDECTOMY Right 02/05/2016   Procedure: RIGHT PARATHYROIDECTOMY;  Surgeon: Armandina Gemma, MD;  Location: [facility];  Service: General;  Laterality: Right;       No family history on file.  Social History   Tobacco Use   Smoking status: Every Lieser    Packs/Johnsey: 0.25    Years: 5.00    Pack years: 1.25    Types: Cigars, Cigarettes   Smokeless tobacco: Never    Home Medications Prior to Admission medications   Medication Sig Start Date End Date Taking? Authorizing Provider  amLODipine (NORVASC) 10 MG tablet Take 10 mg by mouth daily. 12/29/15   [provider]  aspirin EC 81 MG tablet Take 81 mg by mouth daily.    [provider]  cloNIDine (CATAPRES) 0.1 MG tablet Take 0.1 mg by mouth daily. 12/28/15   [provider]  doxazosin (CARDURA) 8 MG tablet Take 8 mg by mouth daily. 12/29/15   [provider]  hydrochlorothiazide (HYDRODIURIL) 25 MG tablet Take 25 mg by mouth daily. 12/29/15   [provider]  HYDROcodone-acetaminophen  (NORCO/VICODIN) 5-325 MG tablet Take 1-2 tablets by mouth every 4 (four) hours as needed for moderate pain. 02/05/16   Armandina Gemma, MD  KLOR-CON M10 10 MEQ tablet Take 10 mEq by mouth daily. 11/23/15   [provider]  metoprolol succinate (TOPROL-XL) 100 MG 24 hr tablet Take 100 mg by mouth daily. 12/29/15   [provider]  pravastatin (PRAVACHOL) 40 MG tablet Take 40 mg by mouth daily. 12/29/15   [provider]  quinapril (ACCUPRIL) 40 MG tablet Take 40 mg by mouth daily. 12/29/15   [provider]  Vitamin D, Ergocalciferol, (DRISDOL) 50000 units CAPS capsule Take 50,000 Units by mouth every Thursday. 12/30/15   [provider]    Allergies    Patient has no known allergies.  Review of Systems   Review of Systems  Constitutional:  Positive for diaphoresis.  Respiratory:  Positive for shortness of breath.   Cardiovascular:  Positive for leg swelling. Negative for chest pain.  Gastrointestinal:  Negative for abdominal pain, nausea and vomiting.  Genitourinary:  Negative for difficulty urinating.  Musculoskeletal:  Positive for myalgias.  Skin:  Negative for rash and wound.  Allergic/Immunologic: Negative for immunocompromised state.  Neurological:  Negative for weakness and numbness.  Hematological:  Does not bruise/bleed easily.  Psychiatric/Behavioral:  Negative for confusion.   All other systems reviewed and are negative.  Physical Exam Updated Vital Signs BP 111/76 (BP Location: Right Arm)   Pulse 87   Temp 99.1 F (37.3 C) (Temporal)   Resp 20   Ht 6' (1.829 m)   Wt 129.3 kg   SpO2 98%   BMI 38.65 kg/m   Physical Exam Vitals and nursing note reviewed.  Constitutional:      General: He is not in acute distress.    Appearance: He is well-developed. He is not diaphoretic.  HENT:     Head: Normocephalic and atraumatic.  Cardiovascular:     Rate and Rhythm: Normal rate and regular rhythm.     Heart sounds: Normal heart  sounds.  Pulmonary:     Effort: Pulmonary effort is normal.     Breath sounds: Normal breath sounds.  Abdominal:     Palpations: Abdomen is soft.     Tenderness: There is no abdominal tenderness.  Musculoskeletal:        General: Tenderness present.     Right lower leg: Edema present.     Left lower leg: No edema.     Comments: Right lower leg edema, chronic appearing but reports worse recently. Mild tenderness medial right lower leg, questionable warmth without drainage, no streaking. DP pulses present.   Skin:    General: Skin is warm and dry.  Neurological:     Mental Status: He is alert and oriented to person, place, and time.  Psychiatric:        Behavior: Behavior normal.    ED Results / Procedures / Treatments   Labs (all labs ordered are listed, but only abnormal results are displayed) Labs Reviewed  BASIC METABOLIC PANEL - Abnormal; Notable for the following components:      Result Value   Potassium 2.9 (*)    Glucose, Bld 196 (*)    Creatinine, Ser 1.77 (*)    Calcium 8.5 (*)    GFR, Estimated 43 (*)    All other components within normal limits  CBC WITH DIFFERENTIAL/PLATELET - Abnormal; Notable for the following components:   WBC 16.2 (*)    RBC 4.17 (*)    Hemoglobin 12.7 (*)    HCT 37.5 (*)    Neutro Abs 14.8 (*)    Lymphs Abs 0.3 (*)    Abs Immature Granulocytes 0.17 (*)    All other components within normal limits    EKG EKG Interpretation  Date/Time:  Sunday October 25 2020 11:51:23 EDT Ventricular Rate:  110 PR Interval:  150 QRS Duration: 102 QT Interval:  346 QTC Calculation: 468 R Axis:   11 Text Interpretation: Sinus tachycardia Otherwise normal ECG Confirmed by Dene Gentry [phone]) on 10/25/2020 1:04:26 PM  Radiology DG Chest 2 View  Result Date: 10/25/2020 CLINICAL DATA:  sob EXAM: CHEST - 2 VIEW COMPARISON:  November 18, 2005 FINDINGS: Evaluation is limited secondary to patient rotation. The cardiomediastinal silhouette is unchanged  in contour. No pleural effusion. No pneumothorax. No acute pleuroparenchymal abnormality. Visualized abdomen is unremarkable. Multilevel degenerative changes of the thoracic spine. IMPRESSION: No acute cardiopulmonary abnormality. Electronically Signed   By: Valentino Saxon M.D.   On: 10/25/2020 12:28   VAS [place] LOWER EXTREMITY VENOUS (DVT) (ONLY [facility] & [facility] 7a-7p)  Result Date: 10/25/2020  Lower Venous DVT Study Patient Name:  Vincent Adams  Date of Exam:   10/25/2020 Medical Rec #: [id number]       Accession #:    [id number] Date of Birth: May 18, 1960      Patient  Gender: M Patient Age:   49 years Exam Location:  [facility] Procedure:      VAS [place] LOWER EXTREMITY VENOUS (DVT) Referring Phys: HINA KHATRI --------------------------------------------------------------------------------  Limitations: Body habitus and severe edema of the calf. Comparison Study: No prior study Performing Technologist: Sharion Dove RVS  Examination Guidelines: A complete evaluation includes B-mode imaging, spectral Doppler, color Doppler, and power Doppler as needed of all accessible portions of each vessel. Bilateral testing is considered an integral part of a complete examination. Limited examinations for reoccurring indications may be performed as noted. The reflux portion of the exam is performed with the patient in reverse Trendelenburg.  +---------+---------------+---------+-----------+----------+-------------------+ RIGHT    CompressibilityPhasicitySpontaneityPropertiesThrombus Aging      +---------+---------------+---------+-----------+----------+-------------------+ CFV                     Yes      Yes                                      +---------+---------------+---------+-----------+----------+-------------------+ FV Prox  Full                                                             +---------+---------------+---------+-----------+----------+-------------------+ FV Mid   Full                                                              +---------+---------------+---------+-----------+----------+-------------------+ FV DistalFull                                                             +---------+---------------+---------+-----------+----------+-------------------+ PFV      Full                                                             +---------+---------------+---------+-----------+----------+-------------------+ POP      Full           Yes      Yes                                      +---------+---------------+---------+-----------+----------+-------------------+ PTV                                                   patent by color     +---------+---------------+---------+-----------+----------+-------------------+ PERO  Not well visualized +---------+---------------+---------+-----------+----------+-------------------+   Left Technical Findings: Left leg not evaluated.   Summary: RIGHT: - There is no evidence of deep vein thrombosis in the lower extremity. However, portions of this examination were limited- see technologist comments above.   *See table(s) above for measurements and observations.    Preliminary     Procedures Procedures   Medications Ordered in ED Medications  sodium chloride 0.9 % bolus 500 mL (500 mLs Intravenous New Bag/Given 10/25/20 1430)  ondansetron (ZOFRAN) injection 4 mg (4 mg Intravenous Given 10/25/20 1439)  morphine 4 MG/ML injection 4 mg (4 mg Intravenous Given 10/25/20 1439)  potassium chloride SA (KLOR-CON) CR tablet 40 mEq (40 mEq Oral Given 10/25/20 1440)    ED Course  I have reviewed the triage vital signs and the nursing notes.  Pertinent labs & imaging results that were available during my care of the patient were reviewed by me and considered in my medical decision making (see chart for details).  Clinical Course as of 10/25/20 1505  Sun Oct 25, 3729   6912 60 year old male with complaint of pain in his right calf with slightly worse than baseline swelling. Wife reports sweat 2 nights ago, slight SHOB. No orthopnea.  [place] negative for DVT. CTA ordered to evaluate for PE due to leg swelling, SHOB, mildly tachycardic on arrival at 113. BMP with hypokalemia at 2.9, given oral replacement. Also Cr 1.77, not significantly changed from baseline, GFR 43, given IV fluids as he was given contrast with his chest CT. CBC with WBC elevated at 16.2 with increase in neutrophils.  [LM]  1502 Care signed out at change of shift pending CTA to evaluate for PE.  If negative, consider Keflex for possible cellulitis right lower leg. [LM]    Clinical Course User Index [LM] Roque Lias   MDM Rules/Calculators/A&P                           Final Clinical Impression(s) / ED Diagnoses Final diagnoses:  Right leg swelling  Shortness of breath    Rx / DC Orders ED Discharge Orders     None        Roque Lias 10/25/20 1505    Valarie Merino, MD 10/26/20 [phone]

## 2020-10-30 ENCOUNTER — Inpatient Hospital Stay (HOSPITAL_COMMUNITY): Payer: Managed Care, Other (non HMO)

## 2020-10-30 ENCOUNTER — Emergency Department (HOSPITAL_COMMUNITY): Payer: Managed Care, Other (non HMO)

## 2020-10-30 ENCOUNTER — Encounter (HOSPITAL_COMMUNITY): Payer: Self-pay | Admitting: Internal Medicine

## 2020-10-30 ENCOUNTER — Inpatient Hospital Stay (HOSPITAL_COMMUNITY)
Admission: EM | Admit: 2020-10-30 | Discharge: 2020-11-09 | DRG: 683 | Disposition: A | Discharge status: Home / Self Care | Payer: Managed Care, Other (non HMO) | Attending: Internal Medicine | Admitting: Internal Medicine

## 2020-10-30 DIAGNOSIS — N179 Acute kidney failure, unspecified: Secondary | ICD-10-CM

## 2020-10-30 DIAGNOSIS — R319 Hematuria, unspecified: Secondary | ICD-10-CM | POA: Diagnosis present

## 2020-10-30 DIAGNOSIS — L0390 Cellulitis, unspecified: Secondary | ICD-10-CM | POA: Diagnosis not present

## 2020-10-30 DIAGNOSIS — M7989 Other specified soft tissue disorders: Secondary | ICD-10-CM | POA: Diagnosis present

## 2020-10-30 DIAGNOSIS — I10 Essential (primary) hypertension: Secondary | ICD-10-CM | POA: Diagnosis present

## 2020-10-30 DIAGNOSIS — E876 Hypokalemia: Secondary | ICD-10-CM | POA: Diagnosis present

## 2020-10-30 DIAGNOSIS — L538 Other specified erythematous conditions: Secondary | ICD-10-CM | POA: Diagnosis not present

## 2020-10-30 DIAGNOSIS — E890 Postprocedural hypothyroidism: Secondary | ICD-10-CM | POA: Diagnosis present

## 2020-10-30 DIAGNOSIS — Z20822 Contact with and (suspected) exposure to covid-19: Secondary | ICD-10-CM | POA: Diagnosis present

## 2020-10-30 DIAGNOSIS — D649 Anemia, unspecified: Secondary | ICD-10-CM | POA: Diagnosis present

## 2020-10-30 DIAGNOSIS — E872 Acidosis, unspecified: Secondary | ICD-10-CM | POA: Diagnosis present

## 2020-10-30 DIAGNOSIS — R7989 Other specified abnormal findings of blood chemistry: Secondary | ICD-10-CM

## 2020-10-30 DIAGNOSIS — E213 Hyperparathyroidism, unspecified: Secondary | ICD-10-CM | POA: Diagnosis present

## 2020-10-30 DIAGNOSIS — L0291 Cutaneous abscess, unspecified: Secondary | ICD-10-CM

## 2020-10-30 DIAGNOSIS — R3589 Other polyuria: Secondary | ICD-10-CM | POA: Diagnosis present

## 2020-10-30 DIAGNOSIS — L03115 Cellulitis of right lower limb: Secondary | ICD-10-CM | POA: Diagnosis present

## 2020-10-30 DIAGNOSIS — Z79899 Other long term (current) drug therapy: Secondary | ICD-10-CM | POA: Diagnosis not present

## 2020-10-30 DIAGNOSIS — R609 Edema, unspecified: Secondary | ICD-10-CM | POA: Diagnosis not present

## 2020-10-30 DIAGNOSIS — N171 Acute kidney failure with acute cortical necrosis: Secondary | ICD-10-CM | POA: Diagnosis not present

## 2020-10-30 DIAGNOSIS — F17290 Nicotine dependence, other tobacco product, uncomplicated: Secondary | ICD-10-CM | POA: Diagnosis present

## 2020-10-30 DIAGNOSIS — E8729 Other acidosis: Secondary | ICD-10-CM | POA: Diagnosis present

## 2020-10-30 DIAGNOSIS — N170 Acute kidney failure with tubular necrosis: Principal | ICD-10-CM | POA: Diagnosis present

## 2020-10-30 DIAGNOSIS — Z7982 Long term (current) use of aspirin: Secondary | ICD-10-CM | POA: Diagnosis not present

## 2020-10-30 DIAGNOSIS — Z6838 Body mass index (BMI) 38.0-38.9, adult: Secondary | ICD-10-CM

## 2020-10-30 DIAGNOSIS — E669 Obesity, unspecified: Secondary | ICD-10-CM | POA: Diagnosis present

## 2020-10-30 DIAGNOSIS — Z720 Tobacco use: Secondary | ICD-10-CM | POA: Diagnosis not present

## 2020-10-30 DIAGNOSIS — F17210 Nicotine dependence, cigarettes, uncomplicated: Secondary | ICD-10-CM | POA: Diagnosis present

## 2020-10-30 DIAGNOSIS — R8281 Pyuria: Secondary | ICD-10-CM | POA: Diagnosis present

## 2020-10-30 DIAGNOSIS — R600 Localized edema: Secondary | ICD-10-CM | POA: Diagnosis present

## 2020-10-30 DIAGNOSIS — E860 Dehydration: Secondary | ICD-10-CM | POA: Diagnosis present

## 2020-10-30 DIAGNOSIS — E871 Hypo-osmolality and hyponatremia: Secondary | ICD-10-CM | POA: Diagnosis not present

## 2020-10-30 LAB — HEPATIC FUNCTION PANEL
ALT: 142 U/L — ABNORMAL HIGH (ref 0–44)
AST: 80 U/L — ABNORMAL HIGH (ref 15–41)
Albumin: 2.3 g/dL — ABNORMAL LOW (ref 3.5–5.0)
Alkaline Phosphatase: 117 U/L (ref 38–126)
Bilirubin, Direct: 0.1 mg/dL (ref 0.0–0.2)
Indirect Bilirubin: 0.9 mg/dL (ref 0.3–0.9)
Total Bilirubin: 1 mg/dL (ref 0.3–1.2)
Total Protein: 6.9 g/dL (ref 6.5–8.1)

## 2020-10-30 LAB — BASIC METABOLIC PANEL
Anion gap: 20 — ABNORMAL HIGH (ref 5–15)
BUN: 128 mg/dL — ABNORMAL HIGH (ref 8–23)
CO2: 15 mmol/L — ABNORMAL LOW (ref 22–32)
Calcium: 7.8 mg/dL — ABNORMAL LOW (ref 8.9–10.3)
Chloride: 95 mmol/L — ABNORMAL LOW (ref 98–111)
Creatinine, Ser: 11.48 mg/dL — ABNORMAL HIGH (ref 0.61–1.24)
GFR, Estimated: 5 mL/min — ABNORMAL LOW (ref >60)
Glucose, Bld: 129 mg/dL — ABNORMAL HIGH (ref 70–99)
Potassium: 3.4 mmol/L — ABNORMAL LOW (ref 3.5–5.1)
Sodium: 130 mmol/L — ABNORMAL LOW (ref 135–145)

## 2020-10-30 LAB — URINALYSIS, ROUTINE W REFLEX MICROSCOPIC
Bilirubin Urine: NEGATIVE
Glucose, UA: NEGATIVE mg/dL
Ketones, ur: NEGATIVE mg/dL
Nitrite: NEGATIVE
Protein, ur: 30 mg/dL — AB
Specific Gravity, Urine: 1.015 (ref 1.005–1.030)
pH: 5 (ref 5.0–8.0)

## 2020-10-30 LAB — CBC
HCT: 30.5 % — ABNORMAL LOW (ref 39.0–52.0)
Hemoglobin: 10.5 g/dL — ABNORMAL LOW (ref 13.0–17.0)
MCH: 30.3 pg (ref 26.0–34.0)
MCHC: 34.4 g/dL (ref 30.0–36.0)
MCV: 87.9 fL (ref 80.0–100.0)
Platelets: 367 10*3/uL (ref 150–400)
RBC: 3.47 MIL/uL — ABNORMAL LOW (ref 4.22–5.81)
RDW: 14.2 % (ref 11.5–15.5)
WBC: 29.5 10*3/uL — ABNORMAL HIGH (ref 4.0–10.5)
nRBC: 0 % (ref 0.0–0.2)

## 2020-10-30 LAB — CBC WITH DIFFERENTIAL/PLATELET
Abs Immature Granulocytes: 0 10*3/uL (ref 0.00–0.07)
Basophils Absolute: 0 10*3/uL (ref 0.0–0.1)
Basophils Relative: 0 %
Eosinophils Absolute: 0 10*3/uL (ref 0.0–0.5)
Eosinophils Relative: 0 %
HCT: 31.9 % — ABNORMAL LOW (ref 39.0–52.0)
Hemoglobin: 11 g/dL — ABNORMAL LOW (ref 13.0–17.0)
Lymphocytes Relative: 2 %
Lymphs Abs: 0.7 10*3/uL (ref 0.7–4.0)
MCH: 30.6 pg (ref 26.0–34.0)
MCHC: 34.5 g/dL (ref 30.0–36.0)
MCV: 88.6 fL (ref 80.0–100.0)
Monocytes Absolute: 0.3 10*3/uL (ref 0.1–1.0)
Monocytes Relative: 1 %
Neutro Abs: 31.7 10*3/uL — ABNORMAL HIGH (ref 1.7–7.7)
Neutrophils Relative %: 97 %
Platelets: 359 10*3/uL (ref 150–400)
RBC: 3.6 MIL/uL — ABNORMAL LOW (ref 4.22–5.81)
RDW: 14.2 % (ref 11.5–15.5)
WBC: 32.7 10*3/uL — ABNORMAL HIGH (ref 4.0–10.5)
nRBC: 0 % (ref 0.0–0.2)
nRBC: 0 /100 WBC

## 2020-10-30 LAB — I-STAT VENOUS BLOOD GAS, ED
Acid-base deficit: 7 mmol/L — ABNORMAL HIGH (ref 0.0–2.0)
Bicarbonate: 17.9 mmol/L — ABNORMAL LOW (ref 20.0–28.0)
Calcium, Ion: 0.96 mmol/L — ABNORMAL LOW (ref 1.15–1.40)
HCT: 34 % — ABNORMAL LOW (ref 39.0–52.0)
Hemoglobin: 11.6 g/dL — ABNORMAL LOW (ref 13.0–17.0)
O2 Saturation: 82 %
Potassium: 3.1 mmol/L — ABNORMAL LOW (ref 3.5–5.1)
Sodium: 131 mmol/L — ABNORMAL LOW (ref 135–145)
TCO2: 19 mmol/L — ABNORMAL LOW (ref 22–32)
pCO2, Ven: 34 mmHg — ABNORMAL LOW (ref 44.0–60.0)
pH, Ven: 7.33 (ref 7.250–7.430)
pO2, Ven: 49 mmHg — ABNORMAL HIGH (ref 32.0–45.0)

## 2020-10-30 LAB — PROTIME-INR
INR: 1.3 — ABNORMAL HIGH (ref 0.8–1.2)
Prothrombin Time: 16 seconds — ABNORMAL HIGH (ref 11.4–15.2)

## 2020-10-30 LAB — LACTIC ACID, PLASMA
Lactic Acid, Venous: 1.1 mmol/L (ref 0.5–1.9)
Lactic Acid, Venous: 1.3 mmol/L (ref 0.5–1.9)

## 2020-10-30 LAB — MAGNESIUM: Magnesium: 2.7 mg/dL — ABNORMAL HIGH (ref 1.7–2.4)

## 2020-10-30 LAB — SARS CORONAVIRUS 2 (TAT 6-24 HRS): SARS Coronavirus 2: NEGATIVE

## 2020-10-30 LAB — PHOSPHORUS: Phosphorus: 7.1 mg/dL — ABNORMAL HIGH (ref 2.5–4.6)

## 2020-10-30 LAB — CREATININE, SERUM
Creatinine, Ser: 11.63 mg/dL — ABNORMAL HIGH (ref 0.61–1.24)
GFR, Estimated: 5 mL/min — ABNORMAL LOW (ref >60)

## 2020-10-30 LAB — CK: Total CK: 288 U/L (ref 49–397)

## 2020-10-30 MED ORDER — LACTATED RINGERS IV SOLN: INTRAVENOUS | Status: AC

## 2020-10-30 MED ORDER — METOPROLOL SUCCINATE ER 100 MG PO TB24
100.0000 mg | ORAL_TABLET | Freq: Every day | ORAL | Status: DC
  Administered 2020-10-31 – 2020-11-01 (×2): 100 mg via ORAL
  Filled 2020-10-30: qty 4
  Filled 2020-10-30: qty 1

## 2020-10-30 MED ORDER — FENTANYL CITRATE PF 50 MCG/ML IJ SOSY
50.0000 ug | PREFILLED_SYRINGE | Freq: Once | INTRAMUSCULAR | Status: AC
  Administered 2020-10-30: 50 ug via INTRAVENOUS
  Filled 2020-10-30: qty 1

## 2020-10-30 MED ORDER — ACETAMINOPHEN 650 MG RE SUPP: 650.0000 mg | Freq: Four times a day (QID) | RECTAL | Status: DC | PRN

## 2020-10-30 MED ORDER — ASPIRIN 325 MG PO TABS
325.0000 mg | ORAL_TABLET | Freq: Every day | ORAL | Status: DC
  Administered 2020-10-31 – 2020-11-09 (×10): 325 mg via ORAL
  Filled 2020-10-30 (×10): qty 1

## 2020-10-30 MED ORDER — HEPARIN SODIUM (PORCINE) 5000 UNIT/ML IJ SOLN
5000.0000 [IU] | Freq: Three times a day (TID) | INTRAMUSCULAR | Status: DC
  Administered 2020-10-31 – 2020-11-09 (×28): 5000 [IU] via SUBCUTANEOUS
  Filled 2020-10-30 (×29): qty 1

## 2020-10-30 MED ORDER — SODIUM CHLORIDE 0.9 % IV SOLN
1.0000 g | INTRAVENOUS | Status: DC
  Administered 2020-10-31 – 2020-11-07 (×8): 1 g via INTRAVENOUS
  Filled 2020-10-30 (×8): qty 10

## 2020-10-30 MED ORDER — PRAVASTATIN SODIUM 40 MG PO TABS
40.0000 mg | ORAL_TABLET | Freq: Every day | ORAL | Status: DC
  Administered 2020-10-31 – 2020-11-09 (×10): 40 mg via ORAL
  Filled 2020-10-30 (×10): qty 1

## 2020-10-30 MED ORDER — HYDROCODONE-ACETAMINOPHEN 5-325 MG PO TABS
1.0000 | ORAL_TABLET | Freq: Four times a day (QID) | ORAL | Status: DC | PRN
  Administered 2020-10-31 – 2020-11-01 (×2): 1 via ORAL
  Filled 2020-10-30 (×2): qty 1

## 2020-10-30 MED ORDER — SODIUM CHLORIDE 0.9 % IV SOLN
2.0000 g | Freq: Once | INTRAVENOUS | Status: AC
  Administered 2020-10-30: 2 g via INTRAVENOUS
  Filled 2020-10-30: qty 20

## 2020-10-30 MED ORDER — ACETAMINOPHEN 325 MG PO TABS: 650.0000 mg | ORAL_TABLET | Freq: Four times a day (QID) | ORAL | Status: DC | PRN

## 2020-10-30 NOTE — ED Triage Notes (Signed)
Pt here for continued right leg swelling and leg pain. Pt seen here for same 10/25/20. Pt d/c with antibiotics. Pt states its not getting any better.

## 2020-10-30 NOTE — ED Provider Notes (Signed)
[facility] EMERGENCY DEPARTMENT Provider Note   CSN: [id number] Arrival date & time: 10/30/20  1315     History Chief Complaint  Patient presents with   Leg Swelling    Vincent Adams is a 60 y.o. male with Pmhx HTN and hyperparathyroidism who presents to the ED today for worsening RLE swelling.  Chart review patient was seen in the ED on 09/25 for right leg swelling.  He had a negative DVT study as well as a CT angio of his chest which was negative.  He was ultimately treated for cellulitis with Keflex 500 mg 4 times daily.  He states he has been taking his antibiotics as prescribed however has felt generally unwell over the past couple days with lack of appetite.  He states he returned to the ED today for worsening swelling and worsening pain.  She denies any fevers at home.  While in the ER his leg began weeping serosanguineous fluid.  Her wife patient's leg has been swollen for approximately 5 years however over the past week began turning more red and more painful prompting initial ED visit.  Patient does admit to heavy alcohol use.   The history is provided by the patient, the spouse and medical records.      Past Medical History:  Diagnosis Date   Hyperparathyroidism [facility])    Hypertension     Patient Active Problem List   Diagnosis Date Noted   Hyperparathyroidism, primary ([place]) 02/02/2016    Past Surgical History:  Procedure Laterality Date   colonscopy  few yrs ago   left small finger pinning  few yrs back   pin later rmoved   PARATHYROIDECTOMY Right 02/05/2016   Procedure: RIGHT PARATHYROIDECTOMY;  Surgeon: Armandina Gemma, MD;  Location: [facility] ORS;  Service: General;  Laterality: Right;       No family history on file.  Social History   Tobacco Use   Smoking status: Every [place]    Packs/Butrick: 0.25    Years: 5.00    Pack years: 1.25    Types: Cigars, Cigarettes   Smokeless tobacco: Never    Home Medications Prior to Admission medications    Medication Sig Start Date End Date Taking? Authorizing Provider  amLODipine (NORVASC) 10 MG tablet Take 10 mg by mouth daily. 12/29/15   [provider]  aspirin EC 81 MG tablet Take 81 mg by mouth daily.    [provider]  cephALEXin (KEFLEX) 500 MG capsule Take 1 capsule (500 mg total) by mouth 4 (four) times daily for 5 days. 10/25/20 10/30/20  Tedd Sias, PA  cloNIDine (CATAPRES) 0.1 MG tablet Take 0.1 mg by mouth daily. 12/28/15   [provider]  doxazosin (CARDURA) 8 MG tablet Take 8 mg by mouth daily. 12/29/15   [provider]  hydrochlorothiazide (HYDRODIURIL) 25 MG tablet Take 25 mg by mouth daily. 12/29/15   [provider]  HYDROcodone-acetaminophen (NORCO/VICODIN) 5-325 MG tablet Take 1-2 tablets by mouth every 4 (four) hours as needed for moderate pain. 02/05/16   Armandina Gemma, MD  KLOR-CON M10 10 MEQ tablet Take 10 mEq by mouth daily. 11/23/15   [provider]  metoprolol succinate (TOPROL-XL) 100 MG 24 hr tablet Take 100 mg by mouth daily. 12/29/15   [provider]  pravastatin (PRAVACHOL) 40 MG tablet Take 40 mg by mouth daily. 12/29/15   [provider]  quinapril (ACCUPRIL) 40 MG tablet Take 40 mg by mouth daily. 12/29/15   [provider]  Vitamin D, Ergocalciferol, (DRISDOL) 50000 units CAPS capsule Take 50,000 Units by mouth every Thursday. 12/30/15   [provider]    Allergies    Patient has no known allergies.  Review of Systems   Review of Systems  Constitutional:  Negative for chills and fever.  Respiratory:  Negative for shortness of breath.   Cardiovascular:  Positive for leg swelling.  Gastrointestinal:  Negative for abdominal pain.  Genitourinary:  Negative for difficulty urinating.  Musculoskeletal:  Positive for arthralgias.  Skin:  Positive for color change. Negative for wound.  All other systems reviewed and are negative.  Physical Exam Updated Vital  Signs BP 127/79   Pulse 79   Temp 98.4 F (36.9 C) (Oral)   Resp 16   SpO2 95%   Physical Exam Vitals and nursing note reviewed.  Constitutional:      Appearance: He is obese. He is not ill-appearing or diaphoretic.  HENT:     Head: Normocephalic and atraumatic.  Eyes:     Conjunctiva/sclera: Conjunctivae normal.  Cardiovascular:     Rate and Rhythm: Normal rate and regular rhythm.     Pulses: Normal pulses.  Pulmonary:     Effort: Pulmonary effort is normal.     Breath sounds: Normal breath sounds. No wheezing, rhonchi or rales.  Abdominal:     General: There is distension.     Tenderness: There is no abdominal tenderness. There is no guarding or rebound.     Comments: Diffuse abdominal distention without TTP, rebound, or guarding.   Musculoskeletal:     Cervical back: Neck supple.     Right lower leg: Edema present.     Comments: See photo below. Significant swelling to RLE; 3+ pitting edema; slight erythema to the foot with increased warmth; small arount of serosanguinous fluid drainage; Dopplerable DP pulse  Skin:    General: Skin is warm and dry.  Neurological:     Mental Status: He is alert.         ED Results / Procedures / Treatments   Labs (all labs ordered are listed, but only abnormal results are displayed) Labs Reviewed  CBC WITH DIFFERENTIAL/PLATELET - Abnormal; Notable for the following components:      Result Value   WBC 32.7 (*)    RBC 3.60 (*)    Hemoglobin 11.0 (*)    HCT 31.9 (*)    Neutro Abs 31.7 (*)    All other components within normal limits  BASIC METABOLIC PANEL - Abnormal; Notable for the following components:   Sodium 130 (*)    Potassium 3.4 (*)    Chloride 95 (*)    CO2 15 (*)    Glucose, Bld 129 (*)    BUN 128 (*)    Creatinine, Ser 11.48 (*)    Calcium 7.8 (*)    GFR, Estimated 5 (*)    Anion gap 20 (*)    All other components within normal limits  HEPATIC FUNCTION PANEL - Abnormal; Notable for the following components:    Albumin 2.3 (*)    AST 80 (*)    ALT 142 (*)    All other components within normal limits  MAGNESIUM - Abnormal; Notable for the following components:   Magnesium 2.7 (*)    All other components within normal limits  URINALYSIS, ROUTINE W REFLEX MICROSCOPIC - Abnormal; Notable for the following components:   APPearance HAZY (*)    Hgb urine dipstick MODERATE (*)  Protein, ur 30 (*)    Leukocytes,Ua SMALL (*)    Bacteria, UA RARE (*)    Non Squamous Epithelial 0-5 (*)    All other components within normal limits  PROTIME-INR - Abnormal; Notable for the following components:   Prothrombin Time 16.0 (*)    INR 1.3 (*)    All other components within normal limits  PHOSPHORUS - Abnormal; Notable for the following components:   Phosphorus 7.1 (*)    All other components within normal limits  I-STAT VENOUS BLOOD GAS, ED - Abnormal; Notable for the following components:   pCO2, Ven 34.0 (*)    pO2, Ven 49.0 (*)    Bicarbonate 17.9 (*)    TCO2 19 (*)    Acid-base deficit 7.0 (*)    Sodium 131 (*)    Potassium 3.1 (*)    Calcium, Ion 0.96 (*)    HCT 34.0 (*)    Hemoglobin 11.6 (*)    All other components within normal limits  CULTURE, BLOOD (ROUTINE X 2)  CULTURE, BLOOD (ROUTINE X 2)  SARS CORONAVIRUS 2 (TAT 6-24 HRS)  LACTIC ACID, PLASMA  CK  LACTIC ACID, PLASMA  RENAL FUNCTION PANEL    EKG EKG Interpretation  Date/Time:  Friday October 30 2020 17:58:03 EDT Ventricular Rate:  78 PR Interval:  143 QRS Duration: 106 QT Interval:  418 QTC Calculation: 477 R Axis:   39 Text Interpretation: Sinus rhythm Borderline prolonged QT interval Confirmed by Lennice Sites ([id number]) on 10/30/2020 6:04:20 PM  Radiology US RENAL  Result Date: 10/30/2020 CLINICAL DATA:  Elevated creatinine. EXAM: RENAL / URINARY TRACT ULTRASOUND COMPLETE COMPARISON:  CT angiogram chest 10/25/2020. FINDINGS: Right Kidney: Renal measurements: 14.3 x 6.7 x 7.2 cm = volume: 360 mL. Echogenicity within  normal limits. No hydronephrosis. There are 2 cysts identified in the right kidney. One measures 3.7 x 3.5 x 2.4 cm. The other measures 2.6 x 2.9 x 2.4 cm. Left Kidney: Renal measurements: 14.9 x 7.5 x 7.1 cm = volume: 413 mL. Echogenicity within normal limits. There is no hydronephrosis. There are 2 cysts identified in the left kidney. One measures 2.7 x 3.0 x 2.5 cm. The other measures 4.2 x 3.7 x 3.2 cm. Bladder: Under distended and not well evaluated. Other: None. IMPRESSION: 1. No hydronephrosis.  No acute abnormality. 2. Bilateral renal cysts. 3. Bladder not well evaluated. Electronically Signed   By: Ronney Asters M.D.   On: 10/30/2020 17:08   DG Chest Portable 1 View  Result Date: 10/30/2020 CLINICAL DATA:  Leg swelling EXAM: PORTABLE CHEST 1 VIEW COMPARISON:  10/25/2020 FINDINGS: Single frontal view of the chest demonstrates mild enlargement the cardiac silhouette unchanged. No airspace disease, effusion, or pneumothorax. No acute bony abnormalities. IMPRESSION: 1. No acute intrathoracic process. Electronically Signed   By: Randa Ngo M.D.   On: 10/30/2020 18:07    Procedures Procedures   Medications Ordered in ED Medications  lactated ringers infusion ( Intravenous New Bag/Given 10/30/20 1732)  fentaNYL (SUBLIMAZE) injection 50 mcg (50 mcg Intravenous Given 10/30/20 1612)  cefTRIAXone (ROCEPHIN) 2 g in sodium chloride 0.9 % 100 mL IVPB (2 g Intravenous New Bag/Given 10/30/20 1732)    ED Course  I have reviewed the triage vital signs and the nursing notes.  Pertinent labs & imaging results that were available during my care of the patient were reviewed by me and considered in my medical decision making (see chart for details).    MDM Rules/Calculators/A&P  60 year old male who read presents to the ED today with ongoing, worsening, right lower extremity swelling and pain.  Was seen 5 days ago for cellulitis with a negative DVT and negative CTA.  On arrival  to the ED today vitals are stable.    Patient had repeat labs done including CBC and BMP while in the waiting room.  BMP with a creatinine of 11.48 today with a BUN of 128.  Glucose 129, bicarb 15 with a gap of 20.  Lab Results  Component Value Date   CREATININE 11.48 (H) 10/30/2020   CREATININE 1.77 (H) 10/25/2020   CREATININE 1.30 (H) 02/04/2016   CBC with a elevated leukocytosis of 32,700.  Hemoglobin 11.0.  WBC  Date Value Ref Range Status  10/30/2020 32.7 (H) 4.0 - 10.5 K/uL Final  10/25/2020 16.2 (H) 4.0 - 10.5 K/uL Final  02/04/2016 6.7 4.0 - 10.5 K/uL Final   He was brought back to her room after creatinine noted while in the waiting room.  He is in no acute distress.  He has significantly swollen right lower extremity with 3+ pitting edema and slight increase in redness to his right foot.  He does have diffuse abdominal distention however states this is baseline for him.  He is a current every Frohlich drinker.  Does not appear he had a CMP done with LFTs during last ED visit we do not have history of same.  Patient will ultimately need to be admitted for acute renal failure.  We will plan to touch base with nephrology.  Additional labs ordered including lactic acid, blood cultures, LFTs, magnesium, i-STAT vbg.   Discussed case with Dr. Joylene Grapes.  Agrees with renal ultrasound at this time.  Will come evaluate patient. Please see consult note.   Discussed case with ED pharmacist Jenny Reichmann who recommends 1 round of 2 g IV ceftriaxone at this time given worsening kidney function.   Renal ultrasound: IMPRESSION:  1. No hydronephrosis.  No acute abnormality.  2. Bilateral renal cysts.  3. Bladder not well evaluated.   Lfts with AST 80, ALT 142. Alk phos and tbili WNL. Albumin 2.3 INR 1.3 Mag 2.7 U/A with small leuks, 11-20 RBCs, 21-50 WBCs, and rare bacteria. Currently on Ceftriaxone for cellulitis; will cover for UTI. Pt without urinary complaints.  CK 288  Discussed case with Dr.  Velia Meyer [facility] who agrees to evaluate patient for admission.   This note was prepared using Dragon voice recognition software and may include unintentional dictation errors due to the inherent limitations of voice recognition software.   Final Clinical Impression(s) / ED Diagnoses Final diagnoses:  Elevated serum creatinine  Acute renal failure, unspecified acute renal failure type ([place])  Cellulitis of right lower extremity    Rx / DC Orders ED Discharge Orders     None        Eustaquio Maize, PA-C 10/30/20 [place], [place], DO 10/30/20 2033

## 2020-10-30 NOTE — H&P (Signed)
History and Physical    PLEASE NOTE THAT DRAGON DICTATION SOFTWARE WAS USED IN THE CONSTRUCTION OF THIS NOTE.   Vincent Adams KNL:[id number] DOB: 11-05-1960 DOA: 10/30/2020  PCP: Hulan Fess, MD Patient coming from: home   I have personally briefly reviewed patient's old medical records in [place]  Chief Complaint: Right lower extremity erythema  HPI: Vincent Adams is a 60 y.o. male with medical history significant for essential hypertension, chronic tobacco abuse, who is admitted to [facility] on 10/30/2020 with acute renal failure after presenting from home to [facility] ED complaining of right lower extremity erythema.   The patient had originally presented to [facility] emergency department on 10/25/2020 complaining of approximately 1 week of progressive r Dahlstedt/flood warning ight lower extremity erythema associated with tenderness, increased warmth to touch, swelling, in a distribution associated with the anterior aspect of the right lower extremity distal to the right knee.  At that time, he was diagnosed with right lower extremity cellulitis and started on Keflex 500 mg p.o. every 6 hours.  Work-up at that time included right lower extremity venous ultrasound which demonstrated no evidence of acute DVT.  Of note, labs performed on 10/25/2020 were notable for serum sodium 130, creatinine 1.77, with CBC notable for white blood cell count of 16,000.  Patient subsequently discharged home from the ED on 10/25/2020 and reported good interval compliance on the aforementioned Keflex.  However, he presents back to [facility] ED today for further evaluation of interval worsening of right lower extremity erythema, tenderness, increased warmth, swelling, with the presence of serous drainage.  Patient denies any overt associated purulent drainage.  Denies any recent preceding trauma involving the right lower extremity, and is not aware of any overt preceding breaks in the skin integrity  associated with the RLE.  Over the last 5 days, the patient reports decline in appetite resulting in significant reduction in his daily consumption of both food and water.  Denies any associated nausea or vomiting.  Over that timeframe, he notes an average of 1 daily episode of loose stool in the absence of any melena or hematochezia.  Not associate with any abdominal discomfort.  Denies associated subjective fever, chills, rigors, or generalized myalgias.  Denies any numbness or paresthesias associated with the RLE, and denies any  additional rash in any other location, and also denies any acute oral lesions, nor any involvement of the palms/soles.  Denies any known history of underlying diabetes.  Medical history notable for hypertension, with outpatient hypertensive regimen including quinapril.  Denies any recent use of NSAIDs.  Denies any recent dysuria, gross hematuria, or change in urinary urgency/frequency.  No documented history of chronic kidney disease, and denies any history of prior dialysis.   Denies any recent headache, neck stiffness, rhinitis, rhinorrhea, sore throat, sob, wheezing, cough.  No recent known COVID-19 exposures.  Denies any recent chest pain, diaphoresis, palpitations, dizziness, presyncope, or syncope.  No recent orthopnea or PND.    ED Course:  Vital signs in the ED were notable for the following: Temperature max 98.4, heart rate 7889; blood pressure 116/77 -127/79; respiratory rate 15-18, oxygen saturation 95 to 100% on room air.  Labs were notable for the following: CMP notable for the following: Sodium 130 compared to 136 on 10/25/2020, potassium 3.4, bicarbonate 15, anion gap 20, BUN 128, creatinine 11.48, corrected calcium when taking into account hypoalbuminemia noted to be 8.9, albumin 2.3, alkaline phosphatase 117, AST 88, ALT 142, total bilirubin  1.0.  CPK 288.  CBC notable for white blood cell count 32,000 compared to 16,000 on 10/25/2020, with today's differential  associated with 97% neutrophils in the absence of any eosinophils, hemoglobin 11 compared to 12.7 on 10/25/2020.  Lactic acid 1.1.  Urinalysis notable for 21-50 white blood cells, small leukocyte esterase, nitrate negative, no squamous epithelial cells, moderate hemoglobin, 11-20 RBCs, 30 protein.  Screening COVID-19/influenza PCR performed in the ED today, with results currently pending repeat cultures x2 were collected prior to initiation of IV antibiotics.  Imaging and additional notable ED work-up: EKG shows sinus rhythm with heart 78, and no evidence of T wave or ST changes, including no evidence of ST elevation.  Chest x-ray shows no evidence of acute cardiopulmonary process.  EDP discussed the patient's case with the on-call nephrologist, Dr. Joylene Grapes, who has evaluated the patient in the ED today, and subsequently recommends admission to the hospitalist service for further evaluation management of presenting acute renal failure. Dr. Joylene Grapes recommends continuous IV fluids overnight, with repeat renal panel in the morning, as well as renal ultrasound.  Per nephrology, no indication for urgent overnight hemodialysis.  Dr. Joylene Grapes will formally consult and will continue to follow.   While in the ED, the following were administered: Fentanyl 50 mcg IV x1, Rocephin, continuous lactated Ringer's at 100 cc/h.  Subsequently, the patient was admitted to the PCU for further evaluation management of presenting acute renal failure.     Review of Systems: As per HPI otherwise 10 point review of systems negative.   Past Medical History:  Diagnosis Date   Hyperparathyroidism ([place])    Hypertension     Past Surgical History:  Procedure Laterality Date   colonscopy  few yrs ago   left small finger pinning  few yrs back   pin later rmoved   PARATHYROIDECTOMY Right 02/05/2016   Procedure: RIGHT PARATHYROIDECTOMY;  Surgeon: Armandina Gemma, MD;  Location: [facility] ORS;  Service: General;  Laterality: Right;     Social History:  reports that he has been smoking cigars. He has a 1.25 pack-year smoking history. He has never used smokeless tobacco. He reports that he does not currently use drugs. No history on file for alcohol use.   No Known Allergies   Family history reviewed and not pertinent    Prior to Admission medications   Medication Sig Start Date End Date Taking? Authorizing Provider  amLODipine (NORVASC) 10 MG tablet Take 10 mg by mouth daily. 12/29/15   [provider]  aspirin EC 81 MG tablet Take 81 mg by mouth daily.    [provider]  cephALEXin (KEFLEX) 500 MG capsule Take 1 capsule (500 mg total) by mouth 4 (four) times daily for 5 days. 10/25/20 10/30/20  Tedd Sias, PA  cloNIDine (CATAPRES) 0.1 MG tablet Take 0.1 mg by mouth daily. 12/28/15   [provider]  doxazosin (CARDURA) 8 MG tablet Take 8 mg by mouth daily. 12/29/15   [provider]  hydrochlorothiazide (HYDRODIURIL) 25 MG tablet Take 25 mg by mouth daily. 12/29/15   [provider]  HYDROcodone-acetaminophen (NORCO/VICODIN) 5-325 MG tablet Take 1-2 tablets by mouth every 4 (four) hours as needed for moderate pain. 02/05/16   Armandina Gemma, MD  KLOR-CON M10 10 MEQ tablet Take 10 mEq by mouth daily. 11/23/15   [provider]  metoprolol succinate (TOPROL-XL) 100 MG 24 hr tablet Take 100 mg by mouth daily. 12/29/15   [provider]  pravastatin (PRAVACHOL) 40 MG tablet  Take 40 mg by mouth daily. 12/29/15   [provider]  quinapril (ACCUPRIL) 40 MG tablet Take 40 mg by mouth daily. 12/29/15   [provider]  Vitamin D, Ergocalciferol, (DRISDOL) 50000 units CAPS capsule Take 50,000 Units by mouth every Thursday. 12/30/15   [provider]     Objective    Physical Exam: Vitals:   10/30/20 1319 10/30/20 1734 10/30/20 1815 10/30/20 1830  BP: 113/77 116/77 125/76 127/79  Pulse: 89 77 78 79  Resp: 18 15 11 16   Temp:  98.4 F (36.9 C)     TempSrc: Oral     SpO2: 100% 98% 95% 95%    General: appears to be stated age; alert, oriented Skin: warm, dry; right lower extremity erythema associated with the anterior aspect distal to the right knee and associated with increased warmth to the touch, tenderness, swelling, without overt drainage noted at this time.  Not associated with any crepitus.  Head:  AT/[place] Mouth:  Oral mucosa membranes appear dry, normal dentition Neck: supple; trachea midline Heart:  RRR; did not appreciate any M/R/G Lungs: CTAB, did not appreciate any wheezes, rales, or rhonchi Abdomen: + BS; soft, ND, NT Vascular: 2+ pedal pulses b/l; 2+ radial pulses b/l Extremities: RLE erythema, as further noted above, no muscle wasting Neuro: strength and sensation intact in upper and lower extremities b/l    Labs on Admission: I have personally reviewed following labs and imaging studies  CBC: Recent Labs  Lab 10/25/20 1159 10/30/20 1335 10/30/20 1621  WBC 16.2* 32.7*  --   NEUTROABS 14.8* 31.7*  --   HGB 12.7* 11.0* 11.6*  HCT 37.5* 31.9* 34.0*  MCV 89.9 88.6  --   PLT 166 359  --    Basic Metabolic Panel: Recent Labs  Lab 10/25/20 1159 10/30/20 1335 10/30/20 1610 10/30/20 1621  NA 136 130*  --  131*  K 2.9* 3.4*  --  3.1*  CL 101 95*  --   --   CO2 22 15*  --   --   GLUCOSE 196* 129*  --   --   BUN 14 128*  --   --   CREATININE 1.77* 11.48*  --   --   CALCIUM 8.5* 7.8*  --   --   MG  --   --  2.7*  --   PHOS  --   --  7.1*  --    GFR: Estimated Creatinine Clearance: 9.4 mL/min (A) (by C-G formula based on SCr of 11.48 mg/dL (H)). Liver Function Tests: Recent Labs  Lab 10/30/20 1610  AST 80*  ALT 142*  ALKPHOS 117  BILITOT 1.0  PROT 6.9  ALBUMIN 2.3*   No results for input(s): LIPASE, AMYLASE in the last 168 hours. No results for input(s): AMMONIA in the last 168 hours. Coagulation Profile: Recent Labs  Lab 10/30/20 1612  INR 1.3*   Cardiac  Enzymes: Recent Labs  Lab 10/30/20 1610  CKTOTAL 288   BNP (last 3 results) No results for input(s): PROBNP in the last 8760 hours. HbA1C: No results for input(s): HGBA1C in the last 72 hours. CBG: No results for input(s): GLUCAP in the last 168 hours. Lipid Profile: No results for input(s): CHOL, HDL, LDLCALC, TRIG, CHOLHDL, LDLDIRECT in the last 72 hours. Thyroid Function Tests: No results for input(s): TSH, T4TOTAL, FREET4, T3FREE, THYROIDAB in the last 72 hours. Anemia Panel: No results for input(s): VITAMINB12, FOLATE, FERRITIN, TIBC, IRON, RETICCTPCT in the last 72 hours.  Urine analysis:    Component Value Date/Time   COLORURINE YELLOW 10/30/2020 1609   APPEARANCEUR HAZY (A) 10/30/2020 1609   LABSPEC 1.015 10/30/2020 1609   PHURINE 5.0 10/30/2020 1609   GLUCOSEU NEGATIVE 10/30/2020 1609   HGBUR MODERATE (A) 10/30/2020 1609   BILIRUBINUR NEGATIVE 10/30/2020 [place] 10/30/2020 1609   PROTEINUR 30 (A) 10/30/2020 1609   NITRITE NEGATIVE 10/30/2020 1609   LEUKOCYTESUR SMALL (A) 10/30/2020 1609    Radiological Exams on Admission: US RENAL  Result Date: 10/30/2020 CLINICAL DATA:  Elevated creatinine. EXAM: RENAL / URINARY TRACT ULTRASOUND COMPLETE COMPARISON:  CT angiogram chest 10/25/2020. FINDINGS: Right Kidney: Renal measurements: 14.3 x 6.7 x 7.2 cm = volume: 360 mL. Echogenicity within normal limits. No hydronephrosis. There are 2 cysts identified in the right kidney. One measures 3.7 x 3.5 x 2.4 cm. The other measures 2.6 x 2.9 x 2.4 cm. Left Kidney: Renal measurements: 14.9 x 7.5 x 7.1 cm = volume: 413 mL. Echogenicity within normal limits. There is no hydronephrosis. There are 2 cysts identified in the left kidney. One measures 2.7 x 3.0 x 2.5 cm. The other measures 4.2 x 3.7 x 3.2 cm. Bladder: Under distended and not well evaluated. Other: None. IMPRESSION: 1. No hydronephrosis.  No acute abnormality. 2. Bilateral renal cysts. 3. Bladder not well  evaluated. Electronically Signed   By: Ronney Asters M.D.   On: 10/30/2020 17:08   DG Chest Portable 1 View  Result Date: 10/30/2020 CLINICAL DATA:  Leg swelling EXAM: PORTABLE CHEST 1 VIEW COMPARISON:  10/25/2020 FINDINGS: Single frontal view of the chest demonstrates mild enlargement the cardiac silhouette unchanged. No airspace disease, effusion, or pneumothorax. No acute bony abnormalities. IMPRESSION: 1. No acute intrathoracic process. Electronically Signed   By: Randa Ngo M.D.   On: 10/30/2020 18:07     EKG: Independently reviewed, with result as described above.    Assessment/Plan   Vincent Adams is a 60 y.o. male with medical history significant for essential hypertension, chronic tobacco abuse, who is admitted to [facility] on 10/30/2020 with acute renal failure after presenting from home to [facility] ED complaining of right lower extremity erythema.    Principal Problem:   ARF (acute renal failure) (HCC) Active Problems:   Cellulitis of right lower extremity   Acute hyponatremia   Hypokalemia   High anion gap metabolic acidosis   Hypertension   Tobacco abuse    #) Acute renal failure: Presenting creatinine 11.48 relative to most recent prior value 1.77 on 10/25/2020, without any documented history of chronic kidney disease for prior need for dialysis.  Is associated with anion gap metabolic acidosis, but no evidence of acute volume overload.  The patient conveys interval development of an appetite, potentially representing early uremia, but is otherwise alert and oriented.  Of note, presenting serum potassium of 3.4.  Underlying source currently unclear, although differential includes the possibility of acute interstitial nephritis given interval initiation of Keflex in the setting of acute renal failure and interval worsening in right lower extremity erythema.  No evidence of eosinophilia or eosinouria, although the these are not particularly sensitive markers.  In the  setting of recent diagnosis of cellulitis, will also check ASO and complement C3/C4 levels.  Presenting urinalysis notable for the presence of 21-50 white blood cells and 11-20 red blood cells as well as 30 protein. urine Protein to creatinine ratio to further evaluate as well as calculate FeNa.  Additional potential contributions include prerenal implications  on clinical evidence of dehydration in the context of patient's report of significant decline in oral intake, including that relating to consumption of water over the last 4 to 5 days, with the appearance of dry oral mucosal membranes on physical exam.  There may also be an additional potential pharmacologic exacerbating factor in the setting of home ACE inhibitor use.   Dr. Macario Carls of nephrology has been formally consulted, as above. Dr. Joylene Grapes recommends continuous IV fluids overnight, with repeat renal panel in the morning, as well as renal ultrasound.  Per nephrology, no indication for urgent overnight hemodialysis.  Dr. Renette Butters to continue to follow.     Plan: Monitor strict I's and O's and daily weights.  Attempt avoid nephrotoxic agents.  Add on random urine sodium as well as random urine creatinine.  Check ASO,: Complement 3, complement 4.  Add on urine protein to creatinine ratio.  Nephrology formally consulted, as above.  Renal ultrasound, as above.  Repeat renal panel in the morning.  Check serum magnesium and phosphorus level.  Continuous LR at 100 cc/h.  Hold home lisinopril. Will refrain from supplementation of mild presenting hypokalemia for now.Marland Kitchen      #) Cellulitis of right lower extremity: dx on basis of approximately 10 days of right lower extremity erythema associated with tenderness, increased warmth to the touch, swelling, with interval progression following 5-[place] course of Keflex initiated on 10/25/2020, as above. Denies any associated purulent discharge, rendering MRSA to be less likely. No evidence of crepitus on exam to  suggest necrotizing fasciitis.  Of note, right lower extremity is found to be neurovascularly intact, and right lower extremity venous ultrasound on 10/25/2020 showed no evidence of acute DVT.  However, in the setting of interval progression, as above, will also check nonvascular ultrasound of the right lower extremity to assess for abscess that may require optimization of source control.   Does not meet criteria for sepsis at this time given the presence of only 1 SIRS criteria, notably presenting leukocytosis.  However, in the setting of this 1 SIRS criteria, presentation meets criteria for cellulitis considered moderate in severity.  Nonelevated lactate.  No evidence of hypotension.  Overall, in the setting of nonpurulent cellulitis of moderate severity, will expand antibiotic spectrum coverage from first generation cephalosporin to third-generation cephalosporin with initiation of IV Rocephin, along with close monitoring of ensuing renal function given presenting acute renal failure following initiation of Keflex.  Nephrology consulted, as further detailed above.  Blood cultures x2 collected in the ED today followed by initiation of Rocephin.    Plan: Abx in form of Rocephin, as above. Follow for results of blood cultures x2 collected today. repeat CBC with diff in AM. I have placed nursing communication orders asking that current distribution of erythema be outlined, and that affected extremity be elevated to help decrease associated swelling.  In the setting of interval element of acute renal failure, will also check ASO titer.  Nonvascular venous ultrasound of the right lower extremity to assess for abscess, as further detailed above.  As needed Norco.        #) Acute hypo-osmolar hypovolemic hyponatremia: Presenting serum sodium of 130 relative to most recent prior value 136 on 10/25/2020.  While likely multifactorial in nature, suspect at least an element of hypovolumeia, with suspected  contribution from dehydration in the setting of clinical evidence of such as well as report of recent decline in oral intake coinciding with the timeframe of decline of serum sodium levels, with evidence  of dry oral mucosa membranes on today's exam.  Differential also includes intrinsic renal losses in the setting of interval acute renal failure. Will also assess for SIADH, with additional studies as indicated below.  Potential pharmacologic contribution from outpatient HCTZ, although this is likely diffusedly secondary contribution given the long-term use of this medication as conveyed by the patient.  in general, will provide gentle IV fluids to attend to suspected contribution from dehydration, while further evaluating for any additional contributing factors, including SIADH, as further detailed below. Will also check TSH.  Of note, no evidence of associated acute focal neurologic deficits and no report of recent trauma.     Plan: monitor strict I's and O's and daily weights.  check UA, random urine sodium, urine osmolality.  Check serum osmolality to confirm suspected hypoosmolar etiology.  Repeat renal panel in the morning. Check TSH. . Gentle IVF's in the form of LR at 100 cc/h.  Hold home HCTZ.       #) Hypokalemia: Presenting serum potassium mildly low at 3.4.  However, in the context of concomitant presenting acute renal failure, will refrain from potassium supplementation at this time given at heightened risk for ensuing development of hyperkalemia.   Plan: Further evaluation and management of presenting acute renal failure, as further detailed above.  Continuous lactated Ringer's, as above.  Add on serum magnesium level.  Repeat renal panel in the morning.  Monitor on telemetry.      #) Anion gap metabolic acidosis: As evidenced by CMP lab results today, with associated VBG showing no evidence of associated acidemia.  Suspect contribution from presenting acute renal failure.  No evidence  of associated sepsis, established above.  Of note, INR found to be 1.3, and presenting CPK not elevated, as quantified above.  Complaint: Further evaluation management of presenting acute renal failure, as above.  Add on salicylate level.  Repeat INR in the morning.  Repeat renal panel in the morning.      #) Asymptomatic pyuria.  While presenting urinalysis shows 21-50 white blood cells, patient denies any acute urinary symptoms, and does not appear septic at this time.  Consequently, refrain from antibiotics specifically initiated for urinary tract coverage while acknowledging Rocephin is being started for right lower extremity cellulitis, as further detailed above.  Presenting pyuria with the presence of blood cell counts, may be related to presenting ARF, including ddx that includes GN, with further evaluation as above, including as directed by consulted nephrology.   Plan: Further evaluation management of presenting acute renal failure, as above..  Repeat CBC in the morning.  Add on urine culture.  Follow-up results of blood cultures x2 collected today.       #) Essential hypertension: Documented history of such, with patient on several antihypertensive medications as an outpatient, including Norvasc, oral clonidine, doxazosin, HCTZ, Toprol-XL, qunapril.  In the setting of presenting acute renal failure, will hold home ACE inhibitor.  Additionally, in setting of clinical evidence of dehydration given recent decline in oral intake concomitant with interval development of acute hyponatremia, will also hold home HCTZ for now.  Furthermore the setting of presenting underlying infection in the form of suspected right lower extremity cellulitis, with currently normotensive blood pressures, will hold additional home antihypertensive medications for now, will closely monitoring ensuing blood pressure to guide timing of resumption with the same medications.  Plan: Hold home antihypertensive  medications, as above.  Close monitoring of ensuing blood pressure via routine vital signs.  Repeat renal panel in  the morning.      #) Chronic tobacco abuse: The patient acknowledges that he is a current smoker, having smoked approximately 1/4 pack/[place] for at least the last 5 to 7 years.   Plan: Counseled the patient on the importance of complete smoking discontinuation.     DVT prophylaxis: Heparin 5000 units SQ 3 times daily Code Status: Full code Family Communication: Patient's case was discussed with his wife, who is present at bedside Disposition Plan: Per Rounding Team Consults called: Dr. Joylene Grapes of nephrology consulted, as further detailed above ;  Admission status: Inpatient; pcu     Of note, this patient was added by me to the following Admit List/Treatment Team: mcadmits.    Of note, the Adult Admission Order Set (Multimorbid order set) was used by me in the admission process for this patient.   PLEASE NOTE THAT DRAGON DICTATION SOFTWARE WAS USED IN THE CONSTRUCTION OF THIS NOTE.   [facility] Pager [phone] From [place]  Otherwise, please contact night-coverage  [email or web address] Password [facility]   10/30/2020, 7:18 PM

## 2020-10-30 NOTE — ED Notes (Signed)
Bladder scanned pt. 81ml urine residual detected.

## 2020-10-30 NOTE — ED Provider Notes (Signed)
Emergency Medicine Provider Triage Evaluation Note  Vincent Adams , a 60 y.o. male  was evaluated in triage.  Pt complains of right lower leg pain. Recently tx for cellulitis. Had neg [place] and cta last week.  Review of Systems  Positive: Right leg pain, swelling Negative: fever  Physical Exam  BP 113/77 (BP Location: Right Arm)   Pulse 89   Temp 98.4 F (36.9 C) (Oral)   Resp 18   SpO2 100%  Gen:   Awake, no distress   Resp:  Normal effort  MSK:   Moves extremities without difficulty  Other:  3+ pitting edema to the rle with several small open wounds, DP pulse is dopplerable on the right  Medical Decision Making  Medically screening exam initiated at 1:32 PM.  Appropriate orders placed.  Vincent Adams was informed that the remainder of the evaluation will be completed by another provider, this initial triage assessment does not replace that evaluation, and the importance of remaining in the ED until their evaluation is complete.     Rodney Booze, PA-C 10/30/20 1335    Charlesetta Shanks, MD 10/30/20 1500

## 2020-10-30 NOTE — ED Provider Notes (Signed)
I personally evaluated the patient during the encounter and completed a history, physical, procedures, medical decision making to contribute to the overall care of the patient and decision making for the patient briefly, the patient is a 60 y.o. male with history of hypertension who presents to the ED with worsening right lower leg swelling.  Started on Keflex several days ago for possible cellulitis of his right lower leg.  Had DVT study at that time that was unremarkable.  He has noted continued right leg swelling and pain despite starting antibiotics.  Repeat lab work was done prior to our evaluation.  Patient with significant change in his creatinine from 1 to about 11.48.  However no acidosis.  pH is within normal limits.  BUN is 128.  White count is 32.  He does have some abdominal distention on exam and significant leg swelling but right greater than left.  Suspect ongoing infection in his right lower leg.  Pulses are intact.  He denies any abdominal pain, shortness of breath, chest pain.  He does admit to frequent alcohol use.  No obvious usage of heavy amounts of ibuprofen or other medications that would cause this sort of significant renal issue.  Could be hepatorenal process if he is a heavy drinker.  We will get CMP, lactic acid, INR, magnesium, phosphorus, CK.  Nephrology is consulted.  Renal ultrasound has been ordered.  Anticipate admission to medicine.  He does state that he is still making urine.  This chart was dictated using voice recognition software.  Despite best efforts to proofread,  errors can occur which can change the documentation meaning.    EKG Interpretation None            Lennice Sites, DO 10/30/20 1705

## 2020-10-30 NOTE — Consult Note (Signed)
Nephrology Consult   Requesting provider: Lennice Sites, DO Service requesting consult: ER Reason for consult: AKI   Assessment/Recommendations: Vincent Adams is a/an 60 y.o. male with a past medical history HTN, right LLE, and hyperparathyroidism who present w/ LLE pain w/ infection and AKI   Severe AKI likely non-oliguric: Possibly CKD but unclear given minimal laboratory data.  Creatinine was 1.65 days ago and now 11.5.  Patient states he has been urinating fairly well at home but the rise in his creatinine is profound. Hard to say the cause at this time. No hypotension here but given the WBC count he may have been experiencing hypotension causing ATN at home.  His quinapril and diuretics may have exacerbated this.  However, differential is broad at this time and needs further work up.  Fortunately no significant uremic symptoms at this time so dialysis is not necessary yet.  However, we did discuss this and it may become necessary soon.   -Management of infection as below -Try gentle hydration with lactated Ringer's 100 cc/h for 12 hours -Obtain renal ultrasound and urinalysis as well as CK -Continue to monitor daily Cr, Dose meds for GFR -Monitor Daily I/Os, Daily weight  -Maintain MAP>65 for optimal renal perfusion.  -Avoid nephrotoxic medications including NSAIDs and Vanc/Zosyn combo -Currently no indication for HD but very likely necessary if he does not improve -Hold home blood pressure medications at this time  Right lower extremity edema/erythema: Edema is longstanding but erythema is new.  Failure to improve on Keflex.  Now with markedly elevated white blood cell count.  Antibiotics per primary team but given the elevation in his white blood cell count evaluation for necrotizing fasciitis would be prudent.  I will check a CK  Hypertension: History of this on multiple medications including amlodipine, clonidine, hydrochlorothiazide, metoprolol, quinapril.  Would hold medications at  this time.  Hyponatremia: Sodium 130.  Likely in the setting of free water retention from AKI.  Continue to monitor  Hypokalemia: Potassium 3.4.  Lactated Ringer's as above.  Replete gently if needed in the setting of severe AKI.  Anion gap metabolic acidosis: Bicarb 15 with an anion gap of 20 likely in the setting of AKI.  Lactated Ringer's as above.  Consider sodium bicarb tomorrow    Recommendations conveyed to primary service.    [facility] 10/30/2020 4:27 PM   _____________________________________________________________________________________ CC: Leg pain  History of Present Illness: Vincent Adams is a/an 60 y.o. male with a past medical history of hyperparathyroidism and hypertension and chronic lower extremity edema who presents with leg pain.  The patient was initially seen in the emergency department on 10/25/2020 for right lower extremity leg pain for several days.  He also was having fevers couple days before he came to the emergency department and also woke up in a cold sweat.  He has had chronic lower extremity edema of the right leg for several years with work-up that has been negative.  No previous clot in his leg but he is aware of and was always told it was secondary to poor circulation.  Because of the erythema in the emergency department he was given Keflex and discharged.  Creatinine was 1.6 at that time.  Laboratory data is fairly minimal so unclear what his baseline creatinine is.  The patient had persistent pain in his leg decided to come back to the emergency department.  Leg demonstrated persistent erythema and continued to have pain.  No more fevers.  He does not notice any difference in his urine output.  He has not been able to drink or eat normally because of nausea but has not had emesis.  Mild diarrhea but nothing severe.  He denies significant confusion or any jerking movements.  No chest pain or shortness of breath.  Feels  like his abdomen is distended to his usual amount.  He has longstanding hypertension and has been taking his medications consistently.  No NSAID use.  Labs in the emergency department demonstrated a creatinine of 11.5, potassium 3.4, sodium 130, hemoglobin 11, white blood cell count 32.7.  Urinalysis and renal ultrasound pending.    Medications:  Current Facility-Administered Medications  Medication Dose Route Frequency Provider Last Rate Last Admin   lactated ringers infusion   Intravenous Continuous Reesa Chew, MD       Current Outpatient Medications  Medication Sig Dispense Refill   amLODipine (NORVASC) 10 MG tablet Take 10 mg by mouth daily.     aspirin EC 81 MG tablet Take 81 mg by mouth daily.     cephALEXin (KEFLEX) 500 MG capsule Take 1 capsule (500 mg total) by mouth 4 (four) times daily for 5 days. 20 capsule 0   cloNIDine (CATAPRES) 0.1 MG tablet Take 0.1 mg by mouth daily.     doxazosin (CARDURA) 8 MG tablet Take 8 mg by mouth daily.     hydrochlorothiazide (HYDRODIURIL) 25 MG tablet Take 25 mg by mouth daily.     HYDROcodone-acetaminophen (NORCO/VICODIN) 5-325 MG tablet Take 1-2 tablets by mouth every 4 (four) hours as needed for moderate pain. 20 tablet 0   KLOR-CON M10 10 MEQ tablet Take 10 mEq by mouth daily.     metoprolol succinate (TOPROL-XL) 100 MG 24 hr tablet Take 100 mg by mouth daily.     pravastatin (PRAVACHOL) 40 MG tablet Take 40 mg by mouth daily.     quinapril (ACCUPRIL) 40 MG tablet Take 40 mg by mouth daily.     Vitamin D, Ergocalciferol, (DRISDOL) 50000 units CAPS capsule Take 50,000 Units by mouth every Thursday.       ALLERGIES Patient has no known allergies.  MEDICAL HISTORY Past Medical History:  Diagnosis Date   Hyperparathyroidism ([place])    Hypertension      SOCIAL HISTORY Social History   Socioeconomic History   Marital status: Married    Spouse name: Not on file   Number of children: Not on file   Years of education: Not on  file   Highest education level: Not on file  Occupational History   Not on file  Tobacco Use   Smoking status: Every [place]    Packs/Wassel: 0.25    Years: 5.00    Pack years: 1.25    Types: Cigars, Cigarettes   Smokeless tobacco: Never  Substance and Sexual Activity   Alcohol use: Not on file   Drug use: Not on file   Sexual activity: Yes  Other Topics Concern   Not on file  Social History Narrative   Not on file   Social Determinants of Health   Financial Resource Strain: Not on file  Food Insecurity: Not on file  Transportation Needs: Not on file  Physical Activity: Not on file  Stress: Not on file  Social Connections: Not on file  Intimate Partner Violence: Not on file     FAMILY HISTORY No family history on file. No known family history of kidney disease. Unable to relate other relevant family history  Review of Systems: 12 systems reviewed Otherwise as per HPI, all other systems reviewed and negative  Physical Exam: Vitals:   10/30/20 1319  BP: 113/77  Pulse: 89  Resp: 18  Temp: 98.4 F (36.9 C)  SpO2: 100%   No intake/output data recorded. No intake or output data in the 24 hours ending 10/30/20 1627 General: well-appearing, no acute distress HEENT: anicteric sclera, oropharynx clear without lesions CV: normal rate, no murmurs, 2+ pitting edema of the right up to the thigh, trace edema at the left ankle Lungs: clear to auscultation bilaterally, normal work of breathing Abd: soft, non-tender, protuberant abdomen, normal bowel sounds Skin: Extensive erythema of the right lower extremity with blistering, otherwise no visible lesions or rashes Psych: alert, engaged, appropriate mood and affect Musculoskeletal: no obvious deformities Neuro: normal speech, no gross focal deficits   Test Results Reviewed Lab Results  Component Value Date   NA 131 (L) 10/30/2020   K 3.1 (L) 10/30/2020   CL 95 (L) 10/30/2020   CO2 15 (L) 10/30/2020   BUN 128 (H)  10/30/2020   CREATININE 11.48 (H) 10/30/2020   CALCIUM 7.8 (L) 10/30/2020     I have reviewed all relevant outside healthcare records related to the patient's current hospitalization

## 2020-10-31 ENCOUNTER — Other Ambulatory Visit: Payer: Self-pay

## 2020-10-31 DIAGNOSIS — N171 Acute kidney failure with acute cortical necrosis: Secondary | ICD-10-CM

## 2020-10-31 LAB — MAGNESIUM: Magnesium: 2.6 mg/dL — ABNORMAL HIGH (ref 1.7–2.4)

## 2020-10-31 LAB — CBC WITH DIFFERENTIAL/PLATELET
Abs Immature Granulocytes: 0 10*3/uL (ref 0.00–0.07)
Basophils Absolute: 0 10*3/uL (ref 0.0–0.1)
Basophils Relative: 0 %
Eosinophils Absolute: 0.6 10*3/uL — ABNORMAL HIGH (ref 0.0–0.5)
Eosinophils Relative: 2 %
HCT: 31.3 % — ABNORMAL LOW (ref 39.0–52.0)
Hemoglobin: 10.7 g/dL — ABNORMAL LOW (ref 13.0–17.0)
Lymphocytes Relative: 2 %
Lymphs Abs: 0.6 10*3/uL — ABNORMAL LOW (ref 0.7–4.0)
MCH: 30.3 pg (ref 26.0–34.0)
MCHC: 34.2 g/dL (ref 30.0–36.0)
MCV: 88.7 fL (ref 80.0–100.0)
Monocytes Absolute: 2.7 10*3/uL — ABNORMAL HIGH (ref 0.1–1.0)
Monocytes Relative: 9 %
Neutro Abs: 25.7 10*3/uL — ABNORMAL HIGH (ref 1.7–7.7)
Neutrophils Relative %: 87 %
Platelets: 381 10*3/uL (ref 150–400)
RBC: 3.53 MIL/uL — ABNORMAL LOW (ref 4.22–5.81)
RDW: 14.3 % (ref 11.5–15.5)
WBC: 29.5 10*3/uL — ABNORMAL HIGH (ref 4.0–10.5)
nRBC: 0 % (ref 0.0–0.2)
nRBC: 0 /100 WBC

## 2020-10-31 LAB — HIV ANTIBODY (ROUTINE TESTING W REFLEX): HIV Screen 4th Generation wRfx: NONREACTIVE

## 2020-10-31 LAB — PROTIME-INR
INR: 1.3 — ABNORMAL HIGH (ref 0.8–1.2)
Prothrombin Time: 15.7 seconds — ABNORMAL HIGH (ref 11.4–15.2)

## 2020-10-31 LAB — RENAL FUNCTION PANEL
Albumin: 2.2 g/dL — ABNORMAL LOW (ref 3.5–5.0)
Anion gap: 19 — ABNORMAL HIGH (ref 5–15)
BUN: 134 mg/dL — ABNORMAL HIGH (ref 8–23)
CO2: 14 mmol/L — ABNORMAL LOW (ref 22–32)
Calcium: 7.7 mg/dL — ABNORMAL LOW (ref 8.9–10.3)
Chloride: 98 mmol/L (ref 98–111)
Creatinine, Ser: 11.75 mg/dL — ABNORMAL HIGH (ref 0.61–1.24)
GFR, Estimated: 4 mL/min — ABNORMAL LOW (ref >60)
Glucose, Bld: 113 mg/dL — ABNORMAL HIGH (ref 70–99)
Phosphorus: 7.5 mg/dL — ABNORMAL HIGH (ref 2.5–4.6)
Potassium: 3.3 mmol/L — ABNORMAL LOW (ref 3.5–5.1)
Sodium: 131 mmol/L — ABNORMAL LOW (ref 135–145)

## 2020-10-31 LAB — HEPATITIS PANEL, ACUTE
HCV Ab: NONREACTIVE
Hep A IgM: NONREACTIVE
Hep B C IgM: NONREACTIVE
Hepatitis B Surface Ag: NONREACTIVE

## 2020-10-31 LAB — OSMOLALITY: Osmolality: 327 mOsm/kg (ref 275–295)

## 2020-10-31 LAB — TSH: TSH: 1.221 u[IU]/mL (ref 0.350–4.500)

## 2020-10-31 LAB — SALICYLATE LEVEL: Salicylate Lvl: <7 mg/dL — ABNORMAL LOW (ref 7.0–30.0)

## 2020-10-31 MED ORDER — DOXAZOSIN MESYLATE 8 MG PO TABS
8.0000 mg | ORAL_TABLET | Freq: Every day | ORAL | Status: DC
  Administered 2020-11-01 – 2020-11-09 (×9): 8 mg via ORAL
  Filled 2020-10-31 (×10): qty 1

## 2020-10-31 MED ORDER — SODIUM BICARBONATE 650 MG PO TABS
1300.0000 mg | ORAL_TABLET | Freq: Two times a day (BID) | ORAL | Status: DC
  Administered 2020-10-31 – 2020-11-04 (×8): 1300 mg via ORAL
  Filled 2020-10-31 (×10): qty 2

## 2020-10-31 MED ORDER — LACTATED RINGERS IV SOLN
INTRAVENOUS | Status: DC
  Administered 2020-10-31: 1000 mL via INTRAVENOUS

## 2020-10-31 NOTE — ED Notes (Signed)
Chart reviewed by this RN, Questions answered via secure chat.

## 2020-10-31 NOTE — ED Notes (Signed)
Breakfast Ordered 

## 2020-10-31 NOTE — Plan of Care (Signed)
  Problem: Education: Goal: Knowledge of General Education information will improve Description: Including pain rating scale, medication(s)/side effects and non-pharmacologic comfort measures Outcome: Progressing   Problem: Health Behavior/Discharge Planning: Goal: Ability to manage health-related needs will improve Outcome: Progressing   Problem: Education: Goal: Knowledge of General Education information will improve Description: Including pain rating scale, medication(s)/side effects and non-pharmacologic comfort measures Outcome: Progressing   Problem: Health Behavior/Discharge Planning: Goal: Ability to manage health-related needs will improve Outcome: Progressing   Problem: Clinical Measurements: Goal: Ability to maintain clinical measurements within normal limits will improve Outcome: Progressing Goal: Will remain free from infection Outcome: Progressing Goal: Diagnostic test results will improve Outcome: Progressing Goal: Respiratory complications will improve Outcome: Progressing Goal: Cardiovascular complication will be avoided Outcome: Progressing

## 2020-10-31 NOTE — Hospital Course (Signed)
60 year old black male HTN chronic tobacco 1 week history RLE erythema-started after ED visit 9/25 Keflex 500 Q6-RLE ultrasound no DVT creatinine 1.7 WBC 16 Patient re-present worsening RLE erythema tenderness warmth, decrease in appetite, no rash no NSAID use Found to have BUNs/creatinine 128/11.4 up from the baseline of 14/1.7 Bicarb 15 Sodium 130 LFTs AST/ALT 80/142 magnesium 2.7 anion gap 20 Renal ultrasound no hydronephrosis

## 2020-10-31 NOTE — Progress Notes (Signed)
PROGRESS NOTE   Vincent Adams  IHK:[id number] DOB: Oct 20, 1960 DOA: 10/30/2020 PCP: Hulan Fess, MD  Brief Narrative:  60 year old black male HTN chronic tobacco 1 week history RLE erythema-started after ED visit 9/25 Keflex 500 Q6-RLE ultrasound no DVT creatinine 1.7 WBC 16 At that visit as part of work-up had CT angiogram chest with contrast to rule out PE Patient re-present worsening RLE erythema tenderness warmth, decrease in appetite, no rash no NSAID use Found to have BUNs/creatinine 128/11.4 up from the baseline of 14/1.7 Bicarb 15 Sodium 130 LFTs AST/ALT 80/142 magnesium 2.7 anion gap 20 Renal ultrasound no hydronephrosis Repeat ultrasound lower extremity showed just generalized erythema without any focal abscess  Hospital-Problem based course  ATN AIN Combination either Keflex and contrast in setting of probable dehydration and continued use of thiazide, ACE Bladder scan only 40 cc urine--hold foley Repeat renal panel a.m. Continue LR 100 cc an hour, replace bicarb orally 1.3 g twice daily Follow FeNA, follow ANCA anti-DNA complement and ANA Appreciate nephro input--opeful for recovery--family aware of discussions that might include HD if no better Right lower extremity erythema Not concerned regarding compartment syndrome as patient ambulatory CK smell Continue ceftriaxone 1 g daily HTN Continue metoprolol XL 100 daily, Pravachol 40 daily Was on HCTZ prior to admission in addition to quinapril which have been held at this time May resume clonidine 0.1 and Norvasc 10 if becomes further elevated Hypokalemia Would check labs but only replace K cautiously given severe ATN BPH? resume Cardura 8 daily   DVT prophylaxis: Heparin Code Status: Full Family Communication: Discussed with wife at the bedside Disposition:  Status is: Inpatient  Remains inpatient appropriate because:Ongoing diagnostic testing needed not appropriate for outpatient work up and IV treatments  appropriate due to intensity of illness or inability to take PO  Dispo: The patient is from: Home              Anticipated d/c is to: Home              Patient currently is not medically stable to d/c.   Difficult to place patient No       Consultants:  Nephrologist multiple  Procedures:   Multiple  Antimicrobials:    Ceftriaxone   Subjective: Looks well Pain in leg ok Passing good urine No cp,fever  Admits to very poor po for ~ 6 days pta--wasn't eating at all--was taking all meds including bp meds  Objective: Vitals:   10/31/20 0445 10/31/20 0515 10/31/20 0545 10/31/20 0815  BP:  (!) 109/49 (!) 117/58 109/89  Pulse: 82 71 71 95  Resp: 18 16 16 18   Temp:      TempSrc:      SpO2: 99% 96% 92% 96%    Intake/Output Summary (Last 24 hours) at 10/31/2020 1124 Last data filed at 10/31/2020 0905 Gross per 24 hour  Intake 1000 ml  Output 850 ml  Net 150 ml   There were no vitals filed for this visit.  Examination:  Coherent nad no focal deficit Cta b no added sound Abd obese nt nd no rebound S1 s2 no m RLE swollen very erythematous--able to plantar and dorisflex-no tenderness Neuro intact to power  Data Reviewed: personally reviewed   CBC    Component Value Date/Time   WBC 29.5 (H) 10/31/2020 0411   RBC 3.53 (L) 10/31/2020 0411   HGB 10.7 (L) 10/31/2020 0411   HCT 31.3 (L) 10/31/2020 0411   PLT 381 10/31/2020 0411   MCV 88.7  10/31/2020 0411   MCH 30.3 10/31/2020 0411   MCHC 34.2 10/31/2020 0411   RDW 14.3 10/31/2020 0411   LYMPHSABS 0.6 (L) 10/31/2020 0411   MONOABS 2.7 (H) 10/31/2020 0411   EOSABS 0.6 (H) 10/31/2020 0411   BASOSABS 0.0 10/31/2020 0411   CMP Latest Ref Rng & Units 10/31/2020 10/30/2020 10/30/2020  Glucose 70 - 99 mg/dL 113(H) - -  BUN 8 - 23 mg/dL 134(H) - -  Creatinine 0.61 - 1.24 mg/dL 11.75(H) 11.63(H) -  Sodium 135 - 145 mmol/L 131(L) - 131(L)  Potassium 3.5 - 5.1 mmol/L 3.3(L) - 3.1(L)  Chloride 98 - 111 mmol/L 98 - -  CO2  22 - 32 mmol/L 14(L) - -  Calcium 8.9 - 10.3 mg/dL 7.7(L) - -  Total Protein 6.5 - 8.1 g/dL - - -  Total Bilirubin 0.3 - 1.2 mg/dL - - -  Alkaline Phos 38 - 126 U/L - - -  AST 15 - 41 U/L - - -  ALT 0 - 44 U/L - - -     Radiology Studies: US RENAL  Result Date: 10/30/2020 CLINICAL DATA:  Elevated creatinine. EXAM: RENAL / URINARY TRACT ULTRASOUND COMPLETE COMPARISON:  CT angiogram chest 10/25/2020. FINDINGS: Right Kidney: Renal measurements: 14.3 x 6.7 x 7.2 cm = volume: 360 mL. Echogenicity within normal limits. No hydronephrosis. There are 2 cysts identified in the right kidney. One measures 3.7 x 3.5 x 2.4 cm. The other measures 2.6 x 2.9 x 2.4 cm. Left Kidney: Renal measurements: 14.9 x 7.5 x 7.1 cm = volume: 413 mL. Echogenicity within normal limits. There is no hydronephrosis. There are 2 cysts identified in the left kidney. One measures 2.7 x 3.0 x 2.5 cm. The other measures 4.2 x 3.7 x 3.2 cm. Bladder: Under distended and not well evaluated. Other: None. IMPRESSION: 1. No hydronephrosis.  No acute abnormality. 2. Bilateral renal cysts. 3. Bladder not well evaluated. Electronically Signed   By: Ronney Asters M.D.   On: 10/30/2020 17:08   DG Chest Portable 1 View  Result Date: 10/30/2020 CLINICAL DATA:  Leg swelling EXAM: PORTABLE CHEST 1 VIEW COMPARISON:  10/25/2020 FINDINGS: Single frontal view of the chest demonstrates mild enlargement the cardiac silhouette unchanged. No airspace disease, effusion, or pneumothorax. No acute bony abnormalities. IMPRESSION: 1. No acute intrathoracic process. Electronically Signed   By: Randa Ngo M.D.   On: 10/30/2020 18:07   [place] RT LOWER EXTREM LTD SOFT TISSUE NON VASCULAR  Result Date: 10/30/2020 CLINICAL DATA:  Right leg pain and swelling, evaluate for possible abscess EXAM: ULTRASOUND RIGHT LOWER EXTREMITY LIMITED TECHNIQUE: Ultrasound examination of the lower extremity soft tissues was performed in the area of clinical concern. COMPARISON:  None.  FINDINGS: Scanning in the area of clinical concern reveals generalized subcutaneous edema. No focal fluid collection is identified to suggest focal abscess. IMPRESSION: Generalized right lower extremity edema without focal fluid collection. Electronically Signed   By: Inez Catalina M.D.   On: 10/30/2020 20:54     Scheduled Meds:  aspirin  325 mg Oral Daily   doxazosin  8 mg Oral Daily   heparin  5,000 Units Subcutaneous Q8H   metoprolol succinate  100 mg Oral Daily   pravastatin  40 mg Oral Daily   sodium bicarbonate  1,300 mg Oral BID   Continuous Infusions:  cefTRIAXone (ROCEPHIN)  IV       LOS: 1 [place]   Time spent: [place], MD [facility] To contact the attending  provider between 7A-7P or the covering provider during after hours 7P-7A, please log into the web site [email or web address] and access using universal  password for that web site. If you do not have the password, please call the hospital operator.  10/31/2020, 11:24 AM

## 2020-10-31 NOTE — Progress Notes (Addendum)
Nephrology Follow-Up Consult note   Assessment/Recommendations: Vincent Adams is a/an 60 y.o. male with a past medical history significant for HTN, right LLE, and hyperparathyroidism who present w/ LLE pain w/ infection and AKI    Severe AKI likely non-oliguric: Possibly CKD but unclear given minimal laboratory data.  Creatinine was 1.6 on 9/25 now up to around 12. Unclear cause but dehydration, ATN, contrast administration and ace use all could be contributing. Given his degree of renal failure and UA w/ pyuria and hematuria we will obtain GN serologies. Fortunately no acute indication for HD and no signs of uremia  -f/u GN serologies -Management of infection as below -Encourage oral hydration for now, can use IVFs as needed -CK and RUS reassuring -Continue to monitor daily Cr, Dose meds for GFR -Monitor Daily I/Os, Daily weight  -Maintain MAP>65 for optimal renal perfusion.  -Avoid nephrotoxic medications including NSAIDs and Vanc/Zosyn combo -Currently no indication for HD but very likely necessary if he does not improve -Hold home blood pressure medications at this time -Phos is 7.5 Would start binder if GFR does not improve   Right lower extremity edema/erythema: Edema is longstanding but erythema is new.  [place] of leg reassuring. Antibiotics per primary team   Hypertension: History of this on multiple medications including amlodipine, clonidine, hydrochlorothiazide, metoprolol, quinapril.  Would hold medications at this time.   Hyponatremia: Sodium 131.  Likely in the setting of free water retention from AKI.  Continue to monitor   Hypokalemia: Potassium 3.3.  Replete gently PRN   Anion gap metabolic acidosis: Likely secondary to AKI.  Start sodium bicarb 1300 mg twice daily.   Recommendations conveyed to primary service.    [facility] 10/31/2020 11:18 AM  ___________________________________________________________  CC: AKI on CKD  Interval  History/Subjective: Patient states he feels well today.  Urinating well yesterday without significant problems.  Feels like the pain in his leg may be slightly better.  No significant fevers or chills.  Kidney function about the same.  Notable that Dr. Verlon Au brought up the patient did receive contrast on 9/25.   Medications:  Current Facility-Administered Medications  Medication Dose Route Frequency Provider Last Rate Last Admin   acetaminophen (TYLENOL) tablet 650 mg  650 mg Oral Q6H PRN Howerter, Justin B, DO       Or   acetaminophen (TYLENOL) suppository 650 mg  650 mg Rectal Q6H PRN Howerter, Justin B, DO       aspirin tablet 325 mg  325 mg Oral Daily Howerter, Justin B, DO   325 mg at 10/31/20 0914   cefTRIAXone (ROCEPHIN) 1 g in sodium chloride 0.9 % 100 mL IVPB  1 g Intravenous Q24H Howerter, Justin B, DO       heparin injection 5,000 Units  5,000 Units Subcutaneous Q8H Howerter, Justin B, DO   5,000 Units at 10/31/20 0914   HYDROcodone-acetaminophen (NORCO/VICODIN) 5-325 MG per tablet 1 tablet  1 tablet Oral Q6H PRN Howerter, Justin B, DO       metoprolol succinate (TOPROL-XL) 24 hr tablet 100 mg  100 mg Oral Daily Howerter, Justin B, DO   100 mg at 10/31/20 0914   pravastatin (PRAVACHOL) tablet 40 mg  40 mg Oral Daily Howerter, Justin B, DO   40 mg at 10/31/20 0947   Current Outpatient Medications  Medication Sig Dispense Refill   acetaminophen (TYLENOL 8 HOUR ARTHRITIS PAIN) 650 MG CR tablet Take 650 mg by mouth daily.  amLODipine (NORVASC) 10 MG tablet Take 10 mg by mouth daily.     aspirin 325 MG tablet Take 325 mg by mouth daily.     cloNIDine (CATAPRES) 0.1 MG tablet Take 0.1 mg by mouth daily.     doxazosin (CARDURA) 8 MG tablet Take 8 mg by mouth daily.     hydrochlorothiazide (HYDRODIURIL) 25 MG tablet Take 25 mg by mouth daily.     KLOR-CON M10 10 MEQ tablet Take 10 mEq by mouth daily.     metoprolol succinate (TOPROL-XL) 100 MG 24 hr tablet Take 100 mg by mouth  daily.     Multiple Vitamin (MULTIVITAMIN WITH MINERALS) TABS tablet Take 1 tablet by mouth daily.     Multiple Vitamins-Minerals (ZINC PO) Take 1 tablet by mouth daily.     Nutritional Supplements (PROSTATE PO) Take 1 capsule by mouth daily.     pravastatin (PRAVACHOL) 40 MG tablet Take 40 mg by mouth daily.     quinapril (ACCUPRIL) 40 MG tablet Take 40 mg by mouth daily.     HYDROcodone-acetaminophen (NORCO/VICODIN) 5-325 MG tablet Take 1-2 tablets by mouth every 4 (four) hours as needed for moderate pain. (Patient not taking: Reported on 10/30/2020) 20 tablet 0      Review of Systems: 10 systems reviewed and negative except per interval history/subjective  Physical Exam: Vitals:   10/31/20 0545 10/31/20 0815  BP: (!) 117/58 109/89  Pulse: 71 95  Resp: 16 18  Temp:    SpO2: 92% 96%   Total I/O In: 1000 [I.V.:1000] Out: 850 [Urine:850]  Intake/Output Summary (Last 24 hours) at 10/31/2020 1118 Last data filed at 10/31/2020 3500 Gross per 24 hour  Intake 1000 ml  Output 850 ml  Net 150 ml   Constitutional: well-appearing, no acute distress ENMT: ears and nose without scars or lesions, MMM CV: normal rate, 2+ edema of the RLE Respiratory: clear to auscultation, normal work of breathing Gastrointestinal: soft, non-tender, no palpable masses or hernias Skin: erythema of right leg largely unchanged, otherwise no visible lesions or rashes Psych: alert, judgement/insight appropriate, appropriate mood and affect   Test Results I personally reviewed new and old clinical labs and radiology tests Lab Results  Component Value Date   NA 131 (L) 10/31/2020   K 3.3 (L) 10/31/2020   CL 98 10/31/2020   CO2 14 (L) 10/31/2020   BUN 134 (H) 10/31/2020   CREATININE 11.75 (H) 10/31/2020   CALCIUM 7.7 (L) 10/31/2020   ALBUMIN 2.2 (L) 10/31/2020   PHOS 7.5 (H) 10/31/2020

## 2020-11-01 DIAGNOSIS — N171 Acute kidney failure with acute cortical necrosis: Secondary | ICD-10-CM | POA: Diagnosis not present

## 2020-11-01 LAB — CBC WITH DIFFERENTIAL/PLATELET
Abs Immature Granulocytes: 2.46 10*3/uL — ABNORMAL HIGH (ref 0.00–0.07)
Basophils Absolute: 0.1 10*3/uL (ref 0.0–0.1)
Basophils Relative: 0 %
Eosinophils Absolute: 0.2 10*3/uL (ref 0.0–0.5)
Eosinophils Relative: 1 %
HCT: 28.1 % — ABNORMAL LOW (ref 39.0–52.0)
Hemoglobin: 9.6 g/dL — ABNORMAL LOW (ref 13.0–17.0)
Immature Granulocytes: 9 %
Lymphocytes Relative: 6 %
Lymphs Abs: 1.6 10*3/uL (ref 0.7–4.0)
MCH: 30.3 pg (ref 26.0–34.0)
MCHC: 34.2 g/dL (ref 30.0–36.0)
MCV: 88.6 fL (ref 80.0–100.0)
Monocytes Absolute: 1.6 10*3/uL — ABNORMAL HIGH (ref 0.1–1.0)
Monocytes Relative: 6 %
Neutro Abs: 21.8 10*3/uL — ABNORMAL HIGH (ref 1.7–7.7)
Neutrophils Relative %: 78 %
Platelets: 404 10*3/uL — ABNORMAL HIGH (ref 150–400)
RBC: 3.17 MIL/uL — ABNORMAL LOW (ref 4.22–5.81)
RDW: 14.4 % (ref 11.5–15.5)
WBC: 27.8 10*3/uL — ABNORMAL HIGH (ref 4.0–10.5)
nRBC: 0 % (ref 0.0–0.2)

## 2020-11-01 LAB — RENAL FUNCTION PANEL
Albumin: 1.9 g/dL — ABNORMAL LOW (ref 3.5–5.0)
Anion gap: 19 — ABNORMAL HIGH (ref 5–15)
BUN: 135 mg/dL — ABNORMAL HIGH (ref 8–23)
CO2: 15 mmol/L — ABNORMAL LOW (ref 22–32)
Calcium: 7.6 mg/dL — ABNORMAL LOW (ref 8.9–10.3)
Chloride: 101 mmol/L (ref 98–111)
Creatinine, Ser: 10.2 mg/dL — ABNORMAL HIGH (ref 0.61–1.24)
GFR, Estimated: 5 mL/min — ABNORMAL LOW (ref >60)
Glucose, Bld: 124 mg/dL — ABNORMAL HIGH (ref 70–99)
Phosphorus: 7.4 mg/dL — ABNORMAL HIGH (ref 2.5–4.6)
Potassium: 3.6 mmol/L (ref 3.5–5.1)
Sodium: 135 mmol/L (ref 135–145)

## 2020-11-01 LAB — RPR: RPR Ser Ql: NONREACTIVE

## 2020-11-01 NOTE — Progress Notes (Signed)
PROGRESS NOTE   Vincent Adams  ZYS:[id number] DOB: [birth year] DOA: 10/30/2020 PCP: Hulan Fess, MD  Brief Narrative:  60 year old black male HTN chronic tobacco 1 week history RLE erythema-started after ED visit 9/25 Keflex 500 Q6-RLE ultrasound no DVT creatinine 1.7 WBC 16 At that visit as part of work-up had CT angiogram chest with contrast to rule out PE Patient re-present worsening RLE erythema tenderness warmth, decrease in appetite, no rash no NSAID use Found to have BUNs/creatinine 128/11.4 up from the baseline of 14/1.7 Bicarb 15 Sodium 130 LFTs AST/ALT 80/142 magnesium 2.7 anion gap 20 Renal ultrasound no hydronephrosis Repeat ultrasound lower extremity showed just generalized erythema without any focal abscess  Nephrology consulted patient is slowly improving  Hospital-Problem based course  ATN AIN Combination Keflex a, IV contrast  dehydration ,continued use of thiazide, ACE Continue LR 100 cc an hour, replace bicarb orally 1.3 g twice daily FeNA not done ANCA anti-DNA complement and ANA pending Recovering slowly--hopefull to avoid HD  Right lower extremity erythema and likely cellulitis Not concerned regarding compartment syndrome as patient seems to be improving White count extremely elevated-unclear etiology continue to monitor Continue ceftriaxone 1 g daily Seems improved regarding erythema/swelling HTN Was on HCTZ prior to admission in addition to quinapril which have been held at this time All BP meds on hold to allow for good renal perfusion Hypokalemia Replace cautiously--daily labs BPH? resume Cardura 8 daily   DVT prophylaxis: Heparin Code Status: Full Family Communication: Discussed with wife at the bedside on 10/1 Disposition:  Status is: Inpatient  Remains inpatient appropriate because:Ongoing diagnostic testing needed not appropriate for outpatient work up and IV treatments appropriate due to intensity of illness or inability to take  PO  Dispo: The patient is from: Home              Anticipated d/c is to: Home              Patient currently is not medically stable to d/c.   Difficult to place patient No    Consultants:  Nephrologist   Procedures:   Multiple  Antimicrobials:    Ceftriaxone   Subjective:  Coherent pleasant no distress eating and drinking without issue Passing urine No fever no chills  Objective: Vitals:   11/01/20 0400 11/01/20 0548 11/01/20 0810 11/01/20 1219  BP: (!) 141/72  (!) 141/76 124/78  Pulse: 82  75 64  Resp: (!) 21  18 16   Temp: 98.7 F (37.1 C)  98.2 F (36.8 C) 98 F (36.7 C)  TempSrc: Oral  Oral Oral  SpO2: 100%  97% 96%  Weight:  128.7 kg    Height:        Intake/Output Summary (Last 24 hours) at 11/01/2020 1508 Last data filed at 11/01/2020 1300 Gross per 24 hour  Intake 1433.4 ml  Output 2675 ml  Net -1241.6 ml    Filed Weights   10/31/20 2130 11/01/20 0548  Weight: 132.9 kg 128.7 kg    Examination:  Coherent nad no focal deficit Cta b no added sound Abd obese nt nd no rebound S1 s2 no m RLE swollen very erythematous--able to plantar and dorisflex-no tenderness    Swelling to my exam is decreased has continued to diminish Neuro intact to power  Data Reviewed: personally reviewed   CBC    Component Value Date/Time   WBC 27.8 (H) 11/01/2020 0139   RBC 3.17 (L) 11/01/2020 0139   HGB 9.6 (L) 11/01/2020 0139   HCT 28.1 (  L) 11/01/2020 0139   PLT 404 (H) 11/01/2020 0139   MCV 88.6 11/01/2020 0139   MCH 30.3 11/01/2020 0139   MCHC 34.2 11/01/2020 0139   RDW 14.4 11/01/2020 0139   LYMPHSABS 1.6 11/01/2020 0139   MONOABS 1.6 (H) 11/01/2020 0139   EOSABS 0.2 11/01/2020 0139   BASOSABS 0.1 11/01/2020 0139   CMP Latest Ref Rng & Units 11/01/2020 10/31/2020 10/30/2020  Glucose 70 - 99 mg/dL 124(H) 113(H) -  BUN 8 - 23 mg/dL 135(H) 134(H) -  Creatinine 0.61 - 1.24 mg/dL 10.20(H) 11.75(H) 11.63(H)  Sodium 135 - 145 mmol/L 135 131(L) -   Potassium 3.5 - 5.1 mmol/L 3.6 3.3(L) -  Chloride 98 - 111 mmol/L 101 98 -  CO2 22 - 32 mmol/L 15(L) 14(L) -  Calcium 8.9 - 10.3 mg/dL 7.6(L) 7.7(L) -  Total Protein 6.5 - 8.1 g/dL - - -  Total Bilirubin 0.3 - 1.2 mg/dL - - -  Alkaline Phos 38 - 126 U/L - - -  AST 15 - 41 U/L - - -  ALT 0 - 44 U/L - - -     Radiology Studies: US RENAL  Result Date: 10/30/2020 CLINICAL DATA:  Elevated creatinine. EXAM: RENAL / URINARY TRACT ULTRASOUND COMPLETE COMPARISON:  CT angiogram chest 10/25/2020. FINDINGS: Right Kidney: Renal measurements: 14.3 x 6.7 x 7.2 cm = volume: 360 mL. Echogenicity within normal limits. No hydronephrosis. There are 2 cysts identified in the right kidney. One measures 3.7 x 3.5 x 2.4 cm. The other measures 2.6 x 2.9 x 2.4 cm. Left Kidney: Renal measurements: 14.9 x 7.5 x 7.1 cm = volume: 413 mL. Echogenicity within normal limits. There is no hydronephrosis. There are 2 cysts identified in the left kidney. One measures 2.7 x 3.0 x 2.5 cm. The other measures 4.2 x 3.7 x 3.2 cm. Bladder: Under distended and not well evaluated. Other: None. IMPRESSION: 1. No hydronephrosis.  No acute abnormality. 2. Bilateral renal cysts. 3. Bladder not well evaluated. Electronically Signed   By: Ronney Asters M.D.   On: 10/30/2020 17:08   DG Chest Portable 1 View  Result Date: 10/30/2020 CLINICAL DATA:  Leg swelling EXAM: PORTABLE CHEST 1 VIEW COMPARISON:  10/25/2020 FINDINGS: Single frontal view of the chest demonstrates mild enlargement the cardiac silhouette unchanged. No airspace disease, effusion, or pneumothorax. No acute bony abnormalities. IMPRESSION: 1. No acute intrathoracic process. Electronically Signed   By: Randa Ngo M.D.   On: 10/30/2020 18:07   [place] RT LOWER EXTREM LTD SOFT TISSUE NON VASCULAR  Result Date: 10/30/2020 CLINICAL DATA:  Right leg pain and swelling, evaluate for possible abscess EXAM: ULTRASOUND RIGHT LOWER EXTREMITY LIMITED TECHNIQUE: Ultrasound examination of the  lower extremity soft tissues was performed in the area of clinical concern. COMPARISON:  None. FINDINGS: Scanning in the area of clinical concern reveals generalized subcutaneous edema. No focal fluid collection is identified to suggest focal abscess. IMPRESSION: Generalized right lower extremity edema without focal fluid collection. Electronically Signed   By: Inez Catalina M.D.   On: 10/30/2020 20:54     Scheduled Meds:  aspirin  325 mg Oral Daily   doxazosin  8 mg Oral Daily   heparin  5,000 Units Subcutaneous Q8H   metoprolol succinate  100 mg Oral Daily   pravastatin  40 mg Oral Daily   sodium bicarbonate  1,300 mg Oral BID   Continuous Infusions:  cefTRIAXone (ROCEPHIN)  IV 1 g (11/01/20 1451)   lactated ringers 100 mL/hr at 11/01/20  0007     LOS: 2 days   Time spent: [place], MD [facility] To contact the attending provider between 7A-7P or the covering provider during after hours 7P-7A, please log into the web site [email or web address] and access using universal  password for that web site. If you do not have the password, please call the hospital operator.  11/01/2020, 3:08 PM

## 2020-11-01 NOTE — Progress Notes (Signed)
Nephrology Follow-Up Consult note   Assessment/Recommendations: Vincent Adams is a/an 60 y.o. male with a past medical history significant for HTN, right LLE, and hyperparathyroidism who present w/ LLE pain w/ infection and AKI    Severe AKI likely non-oliguric: Possibly CKD but unclear given minimal laboratory data.  Creatinine was 1.6 on 9/25 now up to around 12.  Fortunately improved today to 10.2.  There were concerns for possible glomerular disease associated with an infection given hematuria and pyuria on urinalysis but given his improvement his AKI most likely represents multifactorial tubular dysfunction from dehydration, blood pressure medication use, intermittent hypotension, contrast exposure -Can continue LR, encourage oral hydration -Can follow-up GN serologies, unlikely important given the patient is improving at this time -Continue to monitor daily Cr, Dose meds for GFR -Monitor Daily I/Os, Daily weight  -Maintain MAP>65 for optimal renal perfusion.  -Avoid nephrotoxic medications including NSAIDs and Vanc/Zosyn combo -Currently no indication for HD  -Hold home blood pressure medications at this time -Phos is 7.4.  Consider starting phosphorus binder if kidney function remains slow to improve.   Right lower extremity edema/erythema: Edema is longstanding but erythema is new.  [place] of leg reassuring. Antibiotics per primary team.  Leukocytosis slowly improving   Hypertension: History of this on multiple medications including amlodipine, clonidine, hydrochlorothiazide, metoprolol, quinapril.  Would hold medications at this time.   Hyponatremia: Now resolved   Hypokalemia: Improved with repletion   Anion gap metabolic acidosis: Likely secondary to AKI.  Continue sodium bicarb 1300 mg twice daily   Recommendations conveyed to primary service.    [facility] 11/01/2020 9:12  AM  ___________________________________________________________  CC: AKI on CKD  Interval History/Subjective: Patient states he feels fairly well today.  Continues to have some pain and tightness in his leg.  Largely unchanged   Medications:  Current Facility-Administered Medications  Medication Dose Route Frequency Provider Last Rate Last Admin   acetaminophen (TYLENOL) tablet 650 mg  650 mg Oral Q6H PRN Howerter, Justin B, DO       Or   acetaminophen (TYLENOL) suppository 650 mg  650 mg Rectal Q6H PRN Howerter, Justin B, DO       aspirin tablet 325 mg  325 mg Oral Daily Howerter, Justin B, DO   325 mg at 10/31/20 0914   cefTRIAXone (ROCEPHIN) 1 g in sodium chloride 0.9 % 100 mL IVPB  1 g Intravenous Q24H Howerter, Justin B, DO 200 mL/hr at 10/31/20 1745 1 g at 10/31/20 1745   doxazosin (CARDURA) tablet 8 mg  8 mg Oral Daily Nita Sells, MD       heparin injection 5,000 Units  5,000 Units Subcutaneous Q8H Howerter, Justin B, DO   5,000 Units at 11/01/20 0541   HYDROcodone-acetaminophen (NORCO/VICODIN) 5-325 MG per tablet 1 tablet  1 tablet Oral Q6H PRN Howerter, Justin B, DO   1 tablet at 11/01/20 0541   lactated ringers infusion   Intravenous Continuous Nita Sells, MD 100 mL/hr at 11/01/20 0007 New Bag at 11/01/20 0007   metoprolol succinate (TOPROL-XL) 24 hr tablet 100 mg  100 mg Oral Daily Howerter, Justin B, DO   100 mg at 10/31/20 0914   pravastatin (PRAVACHOL) tablet 40 mg  40 mg Oral Daily Howerter, Justin B, DO   40 mg at 10/31/20 8315   sodium bicarbonate tablet 1,300 mg  1,300 mg Oral BID Reesa Chew, MD   1,300 mg at 10/31/20 2134      Review  of Systems: 10 systems reviewed and negative except per interval history/subjective  Physical Exam: Vitals:   11/01/20 0400 11/01/20 0810  BP: (!) 141/72 (!) 141/76  Pulse: 82 75  Resp: (!) 21 18  Temp: 98.7 F (37.1 C) 98.2 F (36.8 C)  SpO2: 100% 97%   No intake/output data  recorded.  Intake/Output Summary (Last 24 hours) at 11/01/2020 0912 Last data filed at 11/01/2020 0600 Gross per 24 hour  Intake 1433.4 ml  Output 1825 ml  Net -391.6 ml   Constitutional: well-appearing, no acute distress ENMT: ears and nose without scars or lesions, MMM CV: normal rate, 2+ pitting edema of the right lower extremity Respiratory: clear to auscultation, normal work of breathing Gastrointestinal: soft, non-tender, no palpable masses or hernias Skin: Erythema of the right lower extremity up to the knee, improved today No visible lesions or rashes Psych: alert, judgement/insight appropriate, appropriate mood and affect   Test Results I personally reviewed new and old clinical labs and radiology tests Lab Results  Component Value Date   NA 135 11/01/2020   K 3.6 11/01/2020   CL 101 11/01/2020   CO2 15 (L) 11/01/2020   BUN 135 (H) 11/01/2020   CREATININE 10.20 (H) 11/01/2020   CALCIUM 7.6 (L) 11/01/2020   ALBUMIN 1.9 (L) 11/01/2020   PHOS 7.4 (H) 11/01/2020

## 2020-11-02 DIAGNOSIS — N171 Acute kidney failure with acute cortical necrosis: Secondary | ICD-10-CM | POA: Diagnosis not present

## 2020-11-02 LAB — RENAL FUNCTION PANEL
Albumin: 2 g/dL — ABNORMAL LOW (ref 3.5–5.0)
Anion gap: 14 (ref 5–15)
BUN: 126 mg/dL — ABNORMAL HIGH (ref 8–23)
CO2: 18 mmol/L — ABNORMAL LOW (ref 22–32)
Calcium: 7.6 mg/dL — ABNORMAL LOW (ref 8.9–10.3)
Chloride: 102 mmol/L (ref 98–111)
Creatinine, Ser: 8.27 mg/dL — ABNORMAL HIGH (ref 0.61–1.24)
GFR, Estimated: 7 mL/min — ABNORMAL LOW (ref >60)
Glucose, Bld: 177 mg/dL — ABNORMAL HIGH (ref 70–99)
Phosphorus: 6.9 mg/dL — ABNORMAL HIGH (ref 2.5–4.6)
Potassium: 3.8 mmol/L (ref 3.5–5.1)
Sodium: 134 mmol/L — ABNORMAL LOW (ref 135–145)

## 2020-11-02 LAB — CBC WITH DIFFERENTIAL/PLATELET
Abs Immature Granulocytes: 0.5 10*3/uL — ABNORMAL HIGH (ref 0.00–0.07)
Basophils Absolute: 0 10*3/uL (ref 0.0–0.1)
Basophils Relative: 0 %
Eosinophils Absolute: 0 10*3/uL (ref 0.0–0.5)
Eosinophils Relative: 0 %
HCT: 29.1 % — ABNORMAL LOW (ref 39.0–52.0)
Hemoglobin: 9.9 g/dL — ABNORMAL LOW (ref 13.0–17.0)
Lymphocytes Relative: 4 %
Lymphs Abs: 1 10*3/uL (ref 0.7–4.0)
MCH: 29.9 pg (ref 26.0–34.0)
MCHC: 34 g/dL (ref 30.0–36.0)
MCV: 87.9 fL (ref 80.0–100.0)
Metamyelocytes Relative: 2 %
Monocytes Absolute: 0.7 10*3/uL (ref 0.1–1.0)
Monocytes Relative: 3 %
Neutro Abs: 21.9 10*3/uL — ABNORMAL HIGH (ref 1.7–7.7)
Neutrophils Relative %: 91 %
Platelets: 455 10*3/uL — ABNORMAL HIGH (ref 150–400)
RBC: 3.31 MIL/uL — ABNORMAL LOW (ref 4.22–5.81)
RDW: 14.4 % (ref 11.5–15.5)
WBC: 24.1 10*3/uL — ABNORMAL HIGH (ref 4.0–10.5)
nRBC: 0 % (ref 0.0–0.2)
nRBC: 0 /100 WBC

## 2020-11-02 LAB — C4 COMPLEMENT
Complement C4, Body Fluid: 16 mg/dL (ref 12–38)
Complement C4, Body Fluid: 18 mg/dL (ref 12–38)

## 2020-11-02 LAB — C3 COMPLEMENT
C3 Complement: 145 mg/dL (ref 82–167)
C3 Complement: 155 mg/dL (ref 82–167)

## 2020-11-02 LAB — ANTISTREPTOLYSIN O TITER: ASO: 48 IU/mL (ref 0.0–200.0)

## 2020-11-02 LAB — KAPPA/LAMBDA LIGHT CHAINS
Kappa free light chain: 229.8 mg/L — ABNORMAL HIGH (ref 3.3–19.4)
Kappa, lambda light chain ratio: 2.02 — ABNORMAL HIGH (ref 0.26–1.65)
Lambda free light chains: 113.6 mg/L — ABNORMAL HIGH (ref 5.7–26.3)

## 2020-11-02 LAB — GLOMERULAR BASEMENT MEMBRANE ANTIBODIES: GBM Ab: <0.2 units (ref 0.0–0.9)

## 2020-11-02 NOTE — Plan of Care (Signed)

## 2020-11-02 NOTE — Progress Notes (Signed)
Nephrology Follow-Up Consult note   Assessment/Recommendations: Vincent Adams is a/an 60 y.o. male with a past medical history significant for HTN, right LLE, and hyperparathyroidism who present w/ LLE pain w/ infection and AKI    Severe AKI likely non-oliguric: Possibly CKD but unclear given minimal laboratory data.  Creatinine was 1.8 on 9/25 and ~12 on presentation 9/30.  Fortunately improving and nonoliguric.  There were concerns for possible glomerular disease associated with an infection given hematuria and pyuria on urinalysis but given his improvement his AKI most likely represents multifactorial tubular dysfunction from dehydration, blood pressure medication use, intermittent hypotension, contrast exposure.  -Maintaining oral hydration - hold IVF today.  -Can follow-up GN serologies, unlikely important given the patient is improving at this time -- all still in process -Continue to monitor daily Cr, Dose meds for GFR -Monitor Daily I/Os, Daily weight  -Maintain MAP>65 for optimal renal perfusion.  -Avoid nephrotoxic medications including NSAIDs and Vanc/Zosyn combo -Currently no indication for HD  -Hold home blood pressure medications at this time -Phos is 6.9 trending down.   Consider starting phosphorus binder if kidney function remains slow to improve.   Right lower extremity edema/erythema: Edema is longstanding (though worse this admission) and erythema is new.  [place] of leg reassuring. Antibiotics per primary team.  Leukocytosis slowly improving   Hypertension: History of this on multiple medications including amlodipine, clonidine, hydrochlorothiazide, metoprolol, quinapril.  Would hold medications at this time.   Hyponatremia: mild, continue daily labs   Hypokalemia: Improved with repletion   Anion gap metabolic acidosis: Likely secondary to AKI.  Continue sodium bicarb 1300 mg twice daily for now.     [facility] 11/02/2020 8:13  AM  ___________________________________________________________  CC: AKI on CKD  Interval History/Subjective: Patient states he feels fairly well today.  I/Os yesterday 1.7 / 2.9 all UOP.  Taking good oral intake.  Denies dysgeusia, diarrhea, emesis.  Still cannot ambulate.     Medications:  Current Facility-Administered Medications  Medication Dose Route Frequency Provider Last Rate Last Admin   acetaminophen (TYLENOL) tablet 650 mg  650 mg Oral Q6H PRN Howerter, Justin B, DO       Or   acetaminophen (TYLENOL) suppository 650 mg  650 mg Rectal Q6H PRN Howerter, Justin B, DO       aspirin tablet 325 mg  325 mg Oral Daily Howerter, Justin B, DO   325 mg at 11/01/20 0932   cefTRIAXone (ROCEPHIN) 1 g in sodium chloride 0.9 % 100 mL IVPB  1 g Intravenous Q24H Howerter, Justin B, DO 200 mL/hr at 11/01/20 1500 Infusion Verify at 11/01/20 1500   doxazosin (CARDURA) tablet 8 mg  8 mg Oral Daily Nita Sells, MD   8 mg at 11/01/20 0932   heparin injection 5,000 Units  5,000 Units Subcutaneous Q8H Howerter, Justin B, DO   5,000 Units at 11/02/20 0616   HYDROcodone-acetaminophen (NORCO/VICODIN) 5-325 MG per tablet 1 tablet  1 tablet Oral Q6H PRN Howerter, Justin B, DO   1 tablet at 11/01/20 0541   lactated ringers infusion   Intravenous Continuous Nita Sells, MD 100 mL/hr at 11/02/20 0619 New Bag at 11/02/20 0619   pravastatin (PRAVACHOL) tablet 40 mg  40 mg Oral Daily Howerter, Justin B, DO   40 mg at 11/01/20 0932   sodium bicarbonate tablet 1,300 mg  1,300 mg Oral BID Reesa Chew, MD   1,300 mg at 11/01/20 2119      Review of  Systems: 10 systems reviewed and negative except per interval history/subjective  Physical Exam: Vitals:   11/02/20 0309 11/02/20 0724  BP: (!) 144/81 140/79  Pulse: 81 73  Resp: 14 15  Temp: 98.7 F (37.1 C) 98.7 F (37.1 C)  SpO2: 96% 97%   No intake/output data recorded.  Intake/Output Summary (Last 24 hours) at 11/02/2020 0813 Last  data filed at 11/02/2020 4315 Gross per 24 hour  Intake 1716.56 ml  Output 2875 ml  Net -1158.44 ml    Constitutional: well-appearing, no acute distress ENMT: ears and nose without scars or lesions, MMM CV: normal rate, 2+ pitting edema of the right lower extremity Respiratory: clear to auscultation, normal work of breathing Gastrointestinal: soft, non-tender, no palpable masses or hernias Skin: Erythema of the right lower extremity up to the knee but No visible lesions or rashes Psych: alert, judgement/insight appropriate, appropriate mood and affect   Test Results I personally reviewed new and old clinical labs and radiology tests Lab Results  Component Value Date   NA 134 (L) 11/02/2020   K 3.8 11/02/2020   CL 102 11/02/2020   CO2 18 (L) 11/02/2020   BUN 126 (H) 11/02/2020   CREATININE 8.27 (H) 11/02/2020   CALCIUM 7.6 (L) 11/02/2020   ALBUMIN 2.0 (L) 11/02/2020   PHOS 6.9 (H) 11/02/2020

## 2020-11-02 NOTE — Progress Notes (Signed)
PROGRESS NOTE   Vincent Adams  OIZ:[id number] DOB: 03-06-1960 DOA: 10/30/2020 PCP: Hulan Fess, MD  Brief Narrative:  60 year old black male HTN chronic tobacco 1 week history RLE erythema-started after ED visit 9/25 Keflex 500 Q6-RLE ultrasound no DVT creatinine 1.7 WBC 16 At that visit as part of work-up had CT angiogram chest with contrast to rule out PE Patient re-present worsening RLE erythema tenderness warmth, decrease in appetite, no rash no NSAID use Found to have BUNs/creatinine 128/11.4 up from the baseline of 14/1.7 Bicarb 15 Sodium 130 LFTs AST/ALT 80/142 magnesium 2.7 anion gap 20 Renal ultrasound no hydronephrosis Repeat ultrasound lower extremity showed just generalized erythema without any focal abscess  Nephrology consulted patient is slowly improving  Hospital-Problem based course  ATN AIN Combination Keflex a, IV contrast  dehydration ,continued use of thiazide, ACE Saline locked by neprho replace bicarb orally 1.3 g twice daily ANCA anti-DNA complement and ANA pending Recovering --likely will not need HD Right lower extremity erythema and likely cellulitis Pain not as well controlled--hard to bear weight--he has some sloughing of skin, so will ask [place] nurse to see Hesitate to use opiates/nsaids with ATN--use tylenol and mobilize as best able--if uncontrolled may give cautious Norco Not concerned regarding compartment syndrome -able to move LE's White count extremely elevated-unclear etiology continue to monitor Continue ceftriaxone 1 g daily Seems improved regarding erythema/swelling HTN Was on HCTZ prior to admission in addition to quinapril  All BP meds on hold to allow for good renal perfusion Hypokalemia Replace cautiously--daily labs BPH? resume Cardura 8 daily   DVT prophylaxis: Heparin Code Status: Full Family Communication: Discussed with wife at the bedside on 10/3 Disposition:  Status is: Inpatient  Remains inpatient appropriate  because:Ongoing diagnostic testing needed not appropriate for outpatient work up and IV treatments appropriate due to intensity of illness or inability to take PO  Dispo: The patient is from: Home              Anticipated d/c is to: Home              Patient currently is not medically stable to d/c.   Difficult to place patient No    Consultants:  Nephrologist   Procedures:   Multiple  Antimicrobials:    Ceftriaxone   Subjective:  some increasing pain in RLE with weight bearing--we had a long talk about the swelling and he will probably need chronic OP follow up Doubt we can resolve all of this inpatient as is long standing  Objective: Vitals:   11/01/20 1900 11/01/20 2340 11/02/20 0309 11/02/20 0724  BP: 124/74 137/68 (!) 144/81 140/79  Pulse: 76 82 81 73  Resp: 20 (!) 22 14 15   Temp: 98.6 F (37 C) 98.7 F (37.1 C) 98.7 F (37.1 C) 98.7 F (37.1 C)  TempSrc: Oral Oral Oral Oral  SpO2: 98% 96% 96% 97%  Weight:      Height:        Intake/Output Summary (Last 24 hours) at 11/02/2020 0944 Last data filed at 11/02/2020 0930 Gross per 24 hour  Intake 3459.96 ml  Output 3050 ml  Net 409.96 ml    Filed Weights   10/31/20 2130 11/01/20 0548  Weight: 132.9 kg 128.7 kg    Examination:  Coherent  Cta b no added sound Abd obese nt nd no rebound S1 s2 no m RLE less swollen-still erythematous--some slough on posterior areas Neuro intact to power  Data Reviewed: personally reviewed   CBC  Component Value Date/Time   WBC 24.1 (H) 11/02/2020 0050   RBC 3.31 (L) 11/02/2020 0050   HGB 9.9 (L) 11/02/2020 0050   HCT 29.1 (L) 11/02/2020 0050   PLT 455 (H) 11/02/2020 0050   MCV 87.9 11/02/2020 0050   MCH 29.9 11/02/2020 0050   MCHC 34.0 11/02/2020 0050   RDW 14.4 11/02/2020 0050   LYMPHSABS 1.0 11/02/2020 0050   MONOABS 0.7 11/02/2020 0050   EOSABS 0.0 11/02/2020 0050   BASOSABS 0.0 11/02/2020 0050   CMP Latest Ref Rng & Units 11/02/2020 11/01/2020 10/31/2020   Glucose 70 - 99 mg/dL 177(H) 124(H) 113(H)  BUN 8 - 23 mg/dL 126(H) 135(H) 134(H)  Creatinine 0.61 - 1.24 mg/dL 8.27(H) 10.20(H) 11.75(H)  Sodium 135 - 145 mmol/L 134(L) 135 131(L)  Potassium 3.5 - 5.1 mmol/L 3.8 3.6 3.3(L)  Chloride 98 - 111 mmol/L 102 101 98  CO2 22 - 32 mmol/L 18(L) 15(L) 14(L)  Calcium 8.9 - 10.3 mg/dL 7.6(L) 7.6(L) 7.7(L)  Total Protein 6.5 - 8.1 g/dL - - -  Total Bilirubin 0.3 - 1.2 mg/dL - - -  Alkaline Phos 38 - 126 U/L - - -  AST 15 - 41 U/L - - -  ALT 0 - 44 U/L - - -     Radiology Studies: No results found.   Scheduled Meds:  aspirin  325 mg Oral Daily   doxazosin  8 mg Oral Daily   heparin  5,000 Units Subcutaneous Q8H   pravastatin  40 mg Oral Daily   sodium bicarbonate  1,300 mg Oral BID   Continuous Infusions:  cefTRIAXone (ROCEPHIN)  IV 200 mL/hr at 11/01/20 1500     LOS: 3 days   Time spent: [place], MD [facility] To contact the attending provider between 7A-7P or the covering provider during after hours 7P-7A, please log into the web site [email or web address] and access using universal [place] password for that web site. If you do not have the password, please call the hospital operator.  11/02/2020, 9:44 AM

## 2020-11-03 DIAGNOSIS — N171 Acute kidney failure with acute cortical necrosis: Secondary | ICD-10-CM | POA: Diagnosis not present

## 2020-11-03 LAB — CBC WITH DIFFERENTIAL/PLATELET
Abs Immature Granulocytes: 0.7 10*3/uL — ABNORMAL HIGH (ref 0.00–0.07)
Basophils Absolute: 0 10*3/uL (ref 0.0–0.1)
Basophils Relative: 0 %
Eosinophils Absolute: 0.2 10*3/uL (ref 0.0–0.5)
Eosinophils Relative: 1 %
HCT: 29.1 % — ABNORMAL LOW (ref 39.0–52.0)
Hemoglobin: 9.8 g/dL — ABNORMAL LOW (ref 13.0–17.0)
Lymphocytes Relative: 1 %
Lymphs Abs: 0.2 10*3/uL — ABNORMAL LOW (ref 0.7–4.0)
MCH: 29.9 pg (ref 26.0–34.0)
MCHC: 33.7 g/dL (ref 30.0–36.0)
MCV: 88.7 fL (ref 80.0–100.0)
Metamyelocytes Relative: 2 %
Monocytes Absolute: 0.9 10*3/uL (ref 0.1–1.0)
Monocytes Relative: 4 %
Myelocytes: 1 %
Neutro Abs: 20.6 10*3/uL — ABNORMAL HIGH (ref 1.7–7.7)
Neutrophils Relative %: 91 %
Platelets: 455 10*3/uL — ABNORMAL HIGH (ref 150–400)
RBC: 3.28 MIL/uL — ABNORMAL LOW (ref 4.22–5.81)
RDW: 14.3 % (ref 11.5–15.5)
WBC: 22.6 10*3/uL — ABNORMAL HIGH (ref 4.0–10.5)
nRBC: 0 % (ref 0.0–0.2)
nRBC: 0 /100 WBC

## 2020-11-03 LAB — ANCA PROFILE
Anti-MPO Antibodies: <0.2 units (ref 0.0–0.9)
Anti-PR3 Antibodies: <0.2 units (ref 0.0–0.9)
Atypical P-ANCA titer: <1:20 {titer}
C-ANCA: <1:20 {titer}
P-ANCA: <1:20 {titer}

## 2020-11-03 LAB — RENAL FUNCTION PANEL
Albumin: 2 g/dL — ABNORMAL LOW (ref 3.5–5.0)
Anion gap: 13 (ref 5–15)
BUN: 105 mg/dL — ABNORMAL HIGH (ref 8–23)
CO2: 21 mmol/L — ABNORMAL LOW (ref 22–32)
Calcium: 8 mg/dL — ABNORMAL LOW (ref 8.9–10.3)
Chloride: 104 mmol/L (ref 98–111)
Creatinine, Ser: 5.45 mg/dL — ABNORMAL HIGH (ref 0.61–1.24)
GFR, Estimated: 11 mL/min — ABNORMAL LOW (ref >60)
Glucose, Bld: 158 mg/dL — ABNORMAL HIGH (ref 70–99)
Phosphorus: 5.6 mg/dL — ABNORMAL HIGH (ref 2.5–4.6)
Potassium: 4.2 mmol/L (ref 3.5–5.1)
Sodium: 138 mmol/L (ref 135–145)

## 2020-11-03 LAB — PROTEIN ELECTROPHORESIS, SERUM
A/G Ratio: 0.6 — ABNORMAL LOW (ref 0.7–1.7)
Albumin ELP: 2.2 g/dL — ABNORMAL LOW (ref 2.9–4.4)
Alpha-1-Globulin: 0.5 g/dL — ABNORMAL HIGH (ref 0.0–0.4)
Alpha-2-Globulin: 1.3 g/dL — ABNORMAL HIGH (ref 0.4–1.0)
Beta Globulin: 0.8 g/dL (ref 0.7–1.3)
Gamma Globulin: 1.1 g/dL (ref 0.4–1.8)
Globulin, Total: 3.7 g/dL (ref 2.2–3.9)
Total Protein ELP: 5.9 g/dL — ABNORMAL LOW (ref 6.0–8.5)

## 2020-11-03 LAB — ANTI-DNA ANTIBODY, DOUBLE-STRANDED: ds DNA Ab: <1 IU/mL (ref 0–9)

## 2020-11-03 LAB — ANA W/REFLEX IF POSITIVE: Anti Nuclear Antibody (ANA): NEGATIVE

## 2020-11-03 MED ORDER — AMLODIPINE BESYLATE 5 MG PO TABS
5.0000 mg | ORAL_TABLET | Freq: Every day | ORAL | Status: DC
  Administered 2020-11-03 – 2020-11-04 (×2): 5 mg via ORAL
  Filled 2020-11-03 (×2): qty 1

## 2020-11-03 MED ORDER — HYDROCODONE-ACETAMINOPHEN 5-325 MG PO TABS
1.0000 | ORAL_TABLET | Freq: Four times a day (QID) | ORAL | Status: DC
  Administered 2020-11-03 – 2020-11-09 (×26): 1 via ORAL
  Filled 2020-11-03 (×26): qty 1

## 2020-11-03 NOTE — Progress Notes (Signed)
Nephrology Follow-Up Consult note   Assessment/Recommendations: Vincent Adams is a/an 60 y.o. male with a past medical history significant for HTN, right LLE, and hyperparathyroidism who present w/ LLE pain w/ infection and AKI    Severe AKI likely non-oliguric: Possibly CKD but unclear given minimal laboratory data.  Creatinine was 1.8 on 9/25 and ~12 on presentation 9/30.  Fortunately improving and nonoliguric.  There were concerns for possible glomerular disease associated with an infection given hematuria and pyuria on urinalysis but given his improvement his AKI most likely represents multifactorial tubular dysfunction from dehydration, blood pressure medication use, intermittent hypotension, contrast exposure.  -Appears entered the post ATN auto diuresis phase now with polyuria; electrolytes are balanced and po intake is good so no need for IVF today as long as tolerating po intake which is is for now  -Can follow-up GN serologies, unlikely important given the patient is improving at this time -- K/L w/I limits for AKI/CKD, antiGBM neg, complements normal, ANCA pending, Hep B/C and HIV neg -Continue to monitor daily Cr, Dose meds for GFR -Monitor Daily I/Os, Daily weight  -Maintain MAP>65 for optimal renal perfusion.  -Avoid nephrotoxic medications including NSAIDs and Vanc/Zosyn combo -Currently no indication for HD  -Hold RAAS inhibition   Right lower extremity edema/erythema: Edema is longstanding (though worse this admission) and erythema is new.  [place] of leg reassuring. Antibiotics per primary team.  Leukocytosis slowly improving   Hypertension: History of this on multiple medications including amlodipine, clonidine, hydrochlorothiazide, metoprolol, quinapril.  resume amlodipine 5 daily   Anemia:  stable in 9s, check iron indices and SPEP; SFLC ok.   Hyponatremia: improved   Hypokalemia: Improved with repletion   Anion gap metabolic acidosis: Likely secondary to AKI.  Continue  sodium bicarb 1300 mg twice daily for now.   [facility] 11/03/2020 6:47 AM  ___________________________________________________________  CC: AKI on CKD  Interval History/Subjective: Patient states he feels fairly well today.  I/Os yesterday 1.4 / 4.3 all UOP.  Taking good oral intake.  Denies dysgeusia, diarrhea, emesis.  Still cannot ambulate and leg pain persists.    Medications:  Current Facility-Administered Medications  Medication Dose Route Frequency Provider Last Rate Last Admin   acetaminophen (TYLENOL) tablet 650 mg  650 mg Oral Q6H PRN Howerter, Justin B, DO       Or   acetaminophen (TYLENOL) suppository 650 mg  650 mg Rectal Q6H PRN Howerter, Justin B, DO       aspirin tablet 325 mg  325 mg Oral Daily Howerter, Justin B, DO   325 mg at 11/02/20 0925   cefTRIAXone (ROCEPHIN) 1 g in sodium chloride 0.9 % 100 mL IVPB  1 g Intravenous Q24H Howerter, Justin B, DO 200 mL/hr at 11/02/20 1455 1 g at 11/02/20 1455   doxazosin (CARDURA) tablet 8 mg  8 mg Oral Daily Nita Sells, MD   8 mg at 11/02/20 0926   heparin injection 5,000 Units  5,000 Units Subcutaneous Q8H Howerter, Justin B, DO   5,000 Units at 11/03/20 2355   HYDROcodone-acetaminophen (NORCO/VICODIN) 5-325 MG per tablet 1 tablet  1 tablet Oral Q6H PRN Howerter, Justin B, DO   1 tablet at 11/01/20 0541   pravastatin (PRAVACHOL) tablet 40 mg  40 mg Oral Daily Howerter, Justin B, DO   40 mg at 11/02/20 7322   sodium bicarbonate tablet 1,300 mg  1,300 mg Oral BID Reesa Chew, MD   1,300 mg at 11/02/20 2132  Review of Systems: 10 systems reviewed and negative except per interval history/subjective  Physical Exam: Vitals:   11/02/20 2002 11/03/20 0030  BP: (!) 149/84 (!) 153/84  Pulse: 91 79  Resp: 17 (!) 23  Temp: 98.5 F (36.9 C) 98.8 F (37.1 C)  SpO2: 98% 97%   Total I/O In: 960 [P.O.:960] Out: 3000 [Urine:3000]  Intake/Output Summary (Last 24 hours) at  11/03/2020 0786 Last data filed at 11/03/2020 7544 Gross per 24 hour  Intake 2943.4 ml  Output 4375 ml  Net -1431.6 ml    Constitutional: well-appearing, no acute distress ENMT: ears and nose without scars or lesions, MMM CV: normal rate, 2+ pitting edema of the right lower extremity but looks improved with more wrinkles present Respiratory: clear to auscultation, normal work of breathing Gastrointestinal: soft, non-tender, no palpable masses or hernias Skin: Erythema of the right lower extremity up to the knee but No visible lesions or rashes Psych: alert, judgement/insight appropriate, appropriate mood and affect   Test Results I personally reviewed new and old clinical labs and radiology tests Lab Results  Component Value Date   NA 138 11/03/2020   K 4.2 11/03/2020   CL 104 11/03/2020   CO2 21 (L) 11/03/2020   BUN 105 (H) 11/03/2020   CREATININE 5.45 (H) 11/03/2020   CALCIUM 8.0 (L) 11/03/2020   ALBUMIN 2.0 (L) 11/03/2020   PHOS 5.6 (H) 11/03/2020

## 2020-11-03 NOTE — Progress Notes (Signed)
PROGRESS NOTE   Vincent Adams  AVW:[id number] DOB: 10/03/1960 DOA: 10/30/2020 PCP: Hulan Fess, MD  Brief Narrative:  60 year old black male HTN chronic tobacco 1 week history RLE erythema-started after ED visit 9/25 Keflex 500 Q6-RLE ultrasound no DVT creatinine 1.7 WBC 16 At that visit as part of work-up had CT angiogram chest with contrast to rule out PE Patient re-present worsening RLE erythema tenderness warmth, decrease in appetite, no rash no NSAID use Found to have BUNs/creatinine 128/11.4 up from the baseline of 14/1.7 Bicarb 15 Sodium 130 LFTs AST/ALT 80/142 magnesium 2.7 anion gap 20 Renal ultrasound no hydronephrosis Repeat ultrasound lower extremity showed just generalized erythema without any focal abscess  Nephrology consulted patient is slowly improving  Hospital-Problem based course  ATN AIN Combination Keflex a, IV contrast  dehydration ,continued use of thiazide, ACE Saline locked by nephro replace bicarb orally 1.3 g twice daily ANCA pending, anti-DNA complement  ANA all NEG Recovering --likely will not need HD Right lower extremity erythema and likely cellulitis WOC nurse tinput pending as skin drying and sloughing--overall improved from admit [see earlier pictures] Give scheduled Norco for pain as creat is improved and should be cleared properly renally Not concerned regarding compartment syndrome -able to move LE's White count extremely elevated-likely form infeciton--no other source Continue ceftriaxone 1 g daily--do not de-escalate for now HTN Was on HCTZ prior to admission in addition to quinapril  All BP meds on hold to allow for good renal perfusion Hypokalemia improved BPH? resume Cardura 8 daily   DVT prophylaxis: Heparin Code Status: Full Family Communication: Discussed with wife at the bedside on 10/3, 10/4 Disposition:  Status is: Inpatient  Remains inpatient appropriate because:Ongoing diagnostic testing needed not appropriate for  outpatient work up and IV treatments appropriate due to intensity of illness or inability to take PO  Dispo: The patient is from: Home              Anticipated d/c is to: Home              Patient currently is not medically stable to d/c.   Difficult to place patient No    Consultants:  Nephrologist   Procedures:   Multiple  Antimicrobials:    Ceftriaxone   Subjective:  Awake coherent no distress Pain is moderate and worse when he moves on his foot He has no fever no chills no nausea no vomiting no dysuria no hematuria  Objective: Vitals:   11/03/20 0030 11/03/20 0656 11/03/20 0840 11/03/20 1307  BP: (!) 153/84 (!) 153/78 (!) 146/88 137/77  Pulse: 79 79 77 86  Resp: (!) 23 17 18 20   Temp: 98.8 F (37.1 C) 98.7 F (37.1 C) 98.7 F (37.1 C) 98.3 F (36.8 C)  TempSrc: Oral Oral Oral Oral  SpO2: 97% 98% 98% 97%  Weight:      Height:        Intake/Output Summary (Last 24 hours) at 11/03/2020 1349 Last data filed at 11/03/2020 8295 Gross per 24 hour  Intake 960 ml  Output 3950 ml  Net -2990 ml    Filed Weights   10/31/20 2130 11/01/20 0548  Weight: 132.9 kg 128.7 kg    Examination:  Pleasant alert no icterus Cta b no added sound Abd obese nt nd no rebound S1 s2 no m RLE less swollen-still erythematous--some slough on posterior areas but no purulence no drainage Neuro intact to power 5/5 power  Data Reviewed: personally reviewed   CBC    Component  Value Date/Time   WBC 22.6 (H) 11/03/2020 0105   RBC 3.28 (L) 11/03/2020 0105   HGB 9.8 (L) 11/03/2020 0105   HCT 29.1 (L) 11/03/2020 0105   PLT 455 (H) 11/03/2020 0105   MCV 88.7 11/03/2020 0105   MCH 29.9 11/03/2020 0105   MCHC 33.7 11/03/2020 0105   RDW 14.3 11/03/2020 0105   LYMPHSABS 0.2 (L) 11/03/2020 0105   MONOABS 0.9 11/03/2020 0105   EOSABS 0.2 11/03/2020 0105   BASOSABS 0.0 11/03/2020 0105   CMP Latest Ref Rng & Units 11/03/2020 11/02/2020 11/01/2020  Glucose 70 - 99 mg/dL 158(H) 177(H)  124(H)  BUN 8 - 23 mg/dL 105(H) 126(H) 135(H)  Creatinine 0.61 - 1.24 mg/dL 5.45(H) 8.27(H) 10.20(H)  Sodium 135 - 145 mmol/L 138 134(L) 135  Potassium 3.5 - 5.1 mmol/L 4.2 3.8 3.6  Chloride 98 - 111 mmol/L 104 102 101  CO2 22 - 32 mmol/L 21(L) 18(L) 15(L)  Calcium 8.9 - 10.3 mg/dL 8.0(L) 7.6(L) 7.6(L)  Total Protein 6.5 - 8.1 g/dL - - -  Total Bilirubin 0.3 - 1.2 mg/dL - - -  Alkaline Phos 38 - 126 U/L - - -  AST 15 - 41 U/L - - -  ALT 0 - 44 U/L - - -     Radiology Studies: No results found.   Scheduled Meds:  amLODipine  5 mg Oral Daily   aspirin  325 mg Oral Daily   doxazosin  8 mg Oral Daily   heparin  5,000 Units Subcutaneous Q8H   HYDROcodone-acetaminophen  1 tablet Oral Q6H   pravastatin  40 mg Oral Daily   sodium bicarbonate  1,300 mg Oral BID   Continuous Infusions:  cefTRIAXone (ROCEPHIN)  IV 1 g (11/02/20 1455)     LOS: 4 days   Time spent: [place], MD [facility] To contact the attending provider between 7A-7P or the covering provider during after hours 7P-7A, please log into the web site [email or web address] and access using universal [place] password for that web site. If you do not have the password, please call the hospital operator.  11/03/2020, 1:49 PM

## 2020-11-03 NOTE — Plan of Care (Signed)

## 2020-11-04 DIAGNOSIS — L03115 Cellulitis of right lower limb: Secondary | ICD-10-CM | POA: Diagnosis not present

## 2020-11-04 DIAGNOSIS — N171 Acute kidney failure with acute cortical necrosis: Secondary | ICD-10-CM | POA: Diagnosis not present

## 2020-11-04 LAB — CULTURE, BLOOD (ROUTINE X 2)
Culture: NO GROWTH
Culture: NO GROWTH
Special Requests: ADEQUATE

## 2020-11-04 LAB — RENAL FUNCTION PANEL
Albumin: 2 g/dL — ABNORMAL LOW (ref 3.5–5.0)
Anion gap: 9 (ref 5–15)
BUN: 77 mg/dL — ABNORMAL HIGH (ref 8–23)
CO2: 24 mmol/L (ref 22–32)
Calcium: 7.9 mg/dL — ABNORMAL LOW (ref 8.9–10.3)
Chloride: 104 mmol/L (ref 98–111)
Creatinine, Ser: 3.42 mg/dL — ABNORMAL HIGH (ref 0.61–1.24)
GFR, Estimated: 20 mL/min — ABNORMAL LOW (ref >60)
Glucose, Bld: 173 mg/dL — ABNORMAL HIGH (ref 70–99)
Phosphorus: 4.8 mg/dL — ABNORMAL HIGH (ref 2.5–4.6)
Potassium: 4.3 mmol/L (ref 3.5–5.1)
Sodium: 137 mmol/L (ref 135–145)

## 2020-11-04 LAB — IRON AND TIBC
Iron: 30 ug/dL — ABNORMAL LOW (ref 45–182)
Saturation Ratios: 14 % — ABNORMAL LOW (ref 17.9–39.5)
TIBC: 207 ug/dL — ABNORMAL LOW (ref 250–450)
UIBC: 177 ug/dL

## 2020-11-04 LAB — FERRITIN: Ferritin: 514 ng/mL — ABNORMAL HIGH (ref 24–336)

## 2020-11-04 MED ORDER — AMLODIPINE BESYLATE 10 MG PO TABS
10.0000 mg | ORAL_TABLET | Freq: Every day | ORAL | Status: DC
  Administered 2020-11-05 – 2020-11-09 (×5): 10 mg via ORAL
  Filled 2020-11-04 (×5): qty 1

## 2020-11-04 NOTE — Plan of Care (Signed)

## 2020-11-04 NOTE — Progress Notes (Signed)
Progress Note    Vincent Adams  QAS:[id number] DOB: September 25, 1960  DOA: 10/30/2020 PCP: Hulan Fess, MD    Brief Narrative:     Medical records reviewed and are as summarized below:  Vincent Adams is an 60 y.o. male with a past medical history significant for HTN, right LLE, and hyperparathyroidism who present w/ LLE pain w/ infection and AKI    Assessment/Plan:   Principal Problem:   ARF (acute renal failure) ([place]) Active Problems:   Cellulitis of right lower extremity   Acute hyponatremia   Hypokalemia   High anion gap metabolic acidosis   Hypertension   Tobacco abuse   ATN  Multifactorial: IV contrast  dehydration ,continued use of thiazide, ACE Saline locked by nephro replace bicarb orally 1.3 g twice daily Renal consult Recovering --likely will not need HD  Right lower extremity erythema and likely cellulitis -overall improved from admit [see earlier pictures] Not concerned regarding compartment syndrome -able to move LE's White count extremely elevated-likely form infeciton--no other source Continue ceftriaxone 1 g daily  HTN Was on HCTZ prior to admission in addition to quinapril  hold to allow for good renal perfusion  Hypokalemia improved  obesity Body mass index is 38.49 kg/m.   Family Communication/Anticipated D/C date and plan/Code Status   DVT prophylaxis: heparin Code Status: Full Code.   Disposition Plan: Status is: Inpatient  Remains inpatient appropriate because:Inpatient level of care appropriate due to severity of illness  Dispo: The patient is from: Home              Anticipated d/c is to: Home              Patient currently is not medically stable to d/c.   Difficult to place patient No         Medical Consultants:   renal  Subjective:   Leg hurt when walking  Objective:    Vitals:   11/03/20 2348 11/04/20 0339 11/04/20 0810 11/04/20 1206  BP: (!) 153/85 135/74 (!) 146/79 137/72  Pulse: 75 65 66 70   Resp: 14 15 16 18   Temp: 98.9 F (37.2 C) 98.7 F (37.1 C) 98.9 F (37.2 C) 98.5 F (36.9 C)  TempSrc: Oral Oral Oral Oral  SpO2: 94% 98% 98% 95%  Weight:      Height:        Intake/Output Summary (Last 24 hours) at 11/04/2020 1407 Last data filed at 11/04/2020 1129 Gross per 24 hour  Intake 626.26 ml  Output 2550 ml  Net -1923.74 ml   Filed Weights   10/31/20 2130 11/01/20 0548  Weight: 132.9 kg 128.7 kg    Exam:  General: Appearance:    Obese male in no acute distress     Lungs:     Clear to auscultation bilaterally, respirations unlabored  Heart:    Normal heart rate. Normal rhythm. No murmurs, rubs, or gallops.    MS:   All extremities are intact. Wrinkles and wounds on right LE along with swelling   Neurologic:   Awake, alert, oriented x 3. No apparent focal neurological           defect.      Data Reviewed:   I have personally reviewed following labs and imaging studies:  Labs: Labs show the following:   Basic Metabolic Panel: Recent Labs  Lab 10/30/20 1610 10/30/20 1621 10/31/20 0411 11/01/20 0139 11/02/20 0050 11/03/20 0105 11/04/20 0114  NA  --    < >  131* 135 134* 138 137  K  --    < > 3.3* 3.6 3.8 4.2 4.3  CL  --   --  98 101 102 104 104  CO2  --   --  14* 15* 18* 21* 24  GLUCOSE  --   --  113* 124* 177* 158* 173*  BUN  --   --  134* 135* 126* 105* 77*  CREATININE  --    < > 11.75* 10.20* 8.27* 5.45* 3.42*  CALCIUM  --   --  7.7* 7.6* 7.6* 8.0* 7.9*  MG 2.7*  --  2.6*  --   --   --   --   PHOS 7.1*  --  7.5* 7.4* 6.9* 5.6* 4.8*   < > = values in this interval not displayed.   GFR Estimated Creatinine Clearance: 31.4 mL/min (A) (by C-G formula based on SCr of 3.42 mg/dL (H)). Liver Function Tests: Recent Labs  Lab 10/30/20 1610 10/31/20 0411 11/01/20 0139 11/02/20 0050 11/03/20 0105 11/04/20 0114  AST 80*  --   --   --   --   --   ALT 142*  --   --   --   --   --   ALKPHOS 117  --   --   --   --   --   BILITOT 1.0  --   --   --    --   --   PROT 6.9  --   --   --   --   --   ALBUMIN 2.3* 2.2* 1.9* 2.0* 2.0* 2.0*   No results for input(s): LIPASE, AMYLASE in the last 168 hours. No results for input(s): AMMONIA in the last 168 hours. Coagulation profile Recent Labs  Lab 10/30/20 1612 10/31/20 0411  INR 1.3* 1.3*    CBC: Recent Labs  Lab 10/30/20 1335 10/30/20 1621 10/30/20 2225 10/31/20 0411 11/01/20 0139 11/02/20 0050 11/03/20 0105  WBC 32.7*  --  29.5* 29.5* 27.8* 24.1* 22.6*  NEUTROABS 31.7*  --   --  25.7* 21.8* 21.9* 20.6*  HGB 11.0*   < > 10.5* 10.7* 9.6* 9.9* 9.8*  HCT 31.9*   < > 30.5* 31.3* 28.1* 29.1* 29.1*  MCV 88.6  --  87.9 88.7 88.6 87.9 88.7  PLT 359  --  367 381 404* 455* 455*   < > = values in this interval not displayed.   Cardiac Enzymes: Recent Labs  Lab 10/30/20 1610  CKTOTAL 288   BNP (last 3 results) No results for input(s): PROBNP in the last 8760 hours. CBG: No results for input(s): GLUCAP in the last 168 hours. D-Dimer: No results for input(s): DDIMER in the last 72 hours. Hgb A1c: No results for input(s): HGBA1C in the last 72 hours. Lipid Profile: No results for input(s): CHOL, HDL, LDLCALC, TRIG, CHOLHDL, LDLDIRECT in the last 72 hours. Thyroid function studies: No results for input(s): TSH, T4TOTAL, T3FREE, THYROIDAB in the last 72 hours.  Invalid input(s): FREET3 Anemia work up: Recent Labs    11/04/20 0114  FERRITIN 514*  TIBC 207*  IRON 30*   Sepsis Labs: Recent Labs  Lab 10/30/20 1610 10/30/20 1736 10/30/20 2225 10/31/20 0411 11/01/20 0139 11/02/20 0050 11/03/20 0105  WBC  --   --    < > 29.5* 27.8* 24.1* 22.6*  LATICACIDVEN 1.1 1.3  --   --   --   --   --    < > = values in this  interval not displayed.    Microbiology Recent Results (from the past 240 hour(s))  Blood culture (routine x 2)     Status: None   Collection Time: 10/30/20  3:36 PM   Specimen: BLOOD  Result Value Ref Range Status   Specimen Description BLOOD RIGHT  ANTECUBITAL  Final   Special Requests   Final    BOTTLES DRAWN AEROBIC AND ANAEROBIC Blood Culture adequate volume   Culture   Final    NO GROWTH 5 DAYS Performed at [facility], [address], [place], [place] [address]    Report Status 11/04/2020 FINAL  Final  Blood culture (routine x 2)     Status: None   Collection Time: 10/30/20  4:10 PM   Specimen: BLOOD  Result Value Ref Range Status   Specimen Description BLOOD RIGHT ANTECUBITAL  Final   Special Requests   Final    BOTTLES DRAWN AEROBIC AND ANAEROBIC Blood Culture results may not be optimal due to an excessive volume of blood received in culture bottles   Culture   Final    NO GROWTH 5 DAYS Performed at [facility], [place] [address] [place], [place] [address]    Report Status 11/04/2020 FINAL  Final  SARS CORONAVIRUS 2 (TAT 6-24 HRS) Nasopharyngeal Nasopharyngeal Swab     Status: None   Collection Time: 10/30/20  4:49 PM   Specimen: Nasopharyngeal Swab  Result Value Ref Range Status   SARS Coronavirus 2 NEGATIVE NEGATIVE Final    Comment: (NOTE) SARS-CoV-2 target nucleic acids are NOT DETECTED.  The SARS-CoV-2 RNA is generally detectable in upper and lower respiratory specimens during the acute phase of infection. Negative results do not preclude SARS-CoV-2 infection, do not rule out co-infections with other pathogens, and should not be used as the sole basis for treatment or other patient management decisions. Negative results must be combined with clinical observations, patient history, and epidemiological information. The expected result is Negative.  Fact Sheet for Patients: SugarRoll.be  Fact Sheet for Healthcare Providers: https://www.woods-mathews.com/  This test is not yet approved or cleared by the [place] FDA and  has been authorized for detection and/or diagnosis of SARS-CoV-2 by FDA under an Emergency Use Authorization (EUA). This EUA will  remain  in effect (meaning this test can be used) for the duration of the COVID-19 declaration under Se ction 564(b)(1) of the Act, 21 U.S.C. section 360bbb-3(b)(1), unless the authorization is terminated or revoked sooner.  Performed at [facility], [place] [address] [place], [place] [address]     Procedures and diagnostic studies:  No results found.  Medications:    [START ON 11/05/2020] amLODipine  10 mg Oral Daily   aspirin  325 mg Oral Daily   doxazosin  8 mg Oral Daily   heparin  5,000 Units Subcutaneous Q8H   HYDROcodone-acetaminophen  1 tablet Oral Q6H   pravastatin  40 mg Oral Daily   Continuous Infusions:  cefTRIAXone (ROCEPHIN)  IV 1 g (11/03/20 1458)     LOS: 5 days   Geradine Girt  [facility]   How to contact the [facility] Attending or Consulting provider [place] or covering provider during after hours [place], for this patient?  Check the care team in [facility] and look for a) attending/consulting [facility] provider listed and b) the [facility] team listed Log into [email or web address] and use [place]'s universal password to access. If you do not have the password, please contact the hospital  operator. Locate the [facility] provider you are looking for under [facility] and page to a number that you can be directly reached. If you still have difficulty reaching the provider, please page the [facility] (Director on Call) for the Hospitalists listed on amion for assistance.  11/04/2020, 2:07 PM

## 2020-11-04 NOTE — Progress Notes (Signed)
Nephrology Follow-Up Consult note   Assessment/Recommendations: Vincent Adams is a/an 60 y.o. male with a past medical history significant for HTN, right LLE, and hyperparathyroidism who present w/ LLE pain w/ infection and AKI    Severe AKI likely non-oliguric: Possibly CKD but unclear given minimal laboratory data.  Creatinine was 1.8 on 9/25 and ~12 on presentation 9/30.  Fortunately improving and nonoliguric.  There were concerns for possible glomerular disease associated with an infection given hematuria and pyuria on urinalysis but given his improvement his AKI most likely represents multifactorial tubular dysfunction from dehydration, blood pressure medication use, intermittent hypotension, contrast exposure.  GN serologies were all negative (K/L wnl for AKI/CKD) except SPEP pending -Substantially improving each [place] now with great UOP - polyuric phase is slowing as expected -Cont to hold RAAS inhibition at this time - will resume outpt if able -Ok to d/c from renal perspective - will arrange f/u with him in clinic in 4 weeks;  -Continue to monitor daily Cr, Dose meds for GFR -Monitor Daily I/Os, Daily weight  -Maintain MAP>65 for optimal renal perfusion.  -Avoid nephrotoxic medications including NSAIDs and Vanc/Zosyn combo -Currently no indication for HD  -Hold RAAS inhibition   Right lower extremity edema/erythema: Edema is longstanding (though worse this admission) and erythema is new.  [place] of leg reassuring. Antibiotics per primary team.  Leukocytosis slowly improving but he remains limited in ambulation due to pain.    Hypertension: History of this on multiple medications including amlodipine, clonidine, hydrochlorothiazide, metoprolol, quinapril.  resumed amlodipine 5 daily titrate to 10mg  daily.  Cont to hold diuretic and ACEi for now.     Anemia:  stable in 9s, ferritin high and iron on lower side. Monitor for now.   Check SPEP; SFLC ok.   Hyponatremia: improved   Hypokalemia:  Improved with repletion   Anion gap metabolic acidosis: Likely secondary to AKI.  Bicarb up to 24 and renal function improving; will stop po bicarb for now.   Will sign off; I'll see him in clinic in 4 weeks.  Suspect he has/will have underlying CKD and will need longitudinal nephrology care; discussed with he and wife today.  They'll receive packet in mail.  PCP is Dr. Claris Gladden  [facility] 11/04/2020 9:28 AM  ___________________________________________________________  CC: AKI on CKD  Interval History/Subjective: Patient states he feels fairly well today.  I/Os yesterday 0.6 / 2.15 all UOP.  Taking good oral intake.  Denies dysgeusia, diarrhea, emesis.  Still cannot ambulate and leg pain persists.    Medications:  Current Facility-Administered Medications  Medication Dose Route Frequency Provider Last Rate Last Admin   acetaminophen (TYLENOL) tablet 650 mg  650 mg Oral Q6H PRN Howerter, Justin B, DO       Or   acetaminophen (TYLENOL) suppository 650 mg  650 mg Rectal Q6H PRN Howerter, Justin B, DO       amLODipine (NORVASC) tablet 5 mg  5 mg Oral Daily Justin Mend, MD   5 mg at 11/04/20 1000   aspirin tablet 325 mg  325 mg Oral Daily Howerter, Justin B, DO   325 mg at 11/04/20 1000   cefTRIAXone (ROCEPHIN) 1 g in sodium chloride 0.9 % 100 mL IVPB  1 g Intravenous Q24H Howerter, Justin B, DO 200 mL/hr at 11/03/20 1458 1 g at 11/03/20 1458   doxazosin (CARDURA) tablet 8 mg  8 mg Oral Daily Nita Sells, MD   8 mg at 11/04/20 1000  heparin injection 5,000 Units  5,000 Units Subcutaneous Q8H Howerter, Justin B, DO   5,000 Units at 11/04/20 1191   HYDROcodone-acetaminophen (NORCO/VICODIN) 5-325 MG per tablet 1 tablet  1 tablet Oral Q6H Nita Sells, MD   1 tablet at 11/04/20 0830   pravastatin (PRAVACHOL) tablet 40 mg  40 mg Oral Daily Howerter, Justin B, DO   40 mg at 11/04/20 1000   sodium bicarbonate tablet 1,300 mg  1,300 mg  Oral BID Reesa Chew, MD   1,300 mg at 11/04/20 1000      Review of Systems: 10 systems reviewed and negative except per interval history/subjective  Physical Exam: Vitals:   11/04/20 0339 11/04/20 0810  BP: 135/74 (!) 146/79  Pulse: 65 66  Resp: 15 16  Temp: 98.7 F (37.1 C) 98.9 F (37.2 C)  SpO2: 98% 98%   No intake/output data recorded.  Intake/Output Summary (Last 24 hours) at 11/04/2020 4782 Last data filed at 11/04/2020 0600 Gross per 24 hour  Intake 626.26 ml  Output 2150 ml  Net -1523.74 ml    Constitutional: well-appearing, no acute distress ENMT: ears and nose without scars or lesions, MMM CV: normal rate, 2+ pitting edema of the right lower extremity but looks improved with more wrinkles present Respiratory: clear to auscultation, normal work of breathing Gastrointestinal: soft, non-tender, no palpable masses or hernias Skin: Erythema of the right lower extremity up to the knee but No visible lesions or rashes Psych: alert, judgement/insight appropriate, appropriate mood and affect   Test Results I personally reviewed new and old clinical labs and radiology tests Lab Results  Component Value Date   NA 137 11/04/2020   K 4.3 11/04/2020   CL 104 11/04/2020   CO2 24 11/04/2020   BUN 77 (H) 11/04/2020   CREATININE 3.42 (H) 11/04/2020   CALCIUM 7.9 (L) 11/04/2020   ALBUMIN 2.0 (L) 11/04/2020   PHOS 4.8 (H) 11/04/2020

## 2020-11-04 NOTE — TOC Initial Note (Signed)
Transition of Care [facility]) - Initial/Assessment Note    Patient Details  Name: Vincent Adams MRN: [id number] Date of Birth: 11/27/1960  [facility] CM/SW Contact:    Cyndi Bender, RN Phone Number: 11/04/2020, 3:14 PM  Clinical Narrative:     Spoke to patient about possible needs discharge. Patient states He lives with his wife in a 3 story house. He states he uses a cane at home but doesn't have any other DME  equipment. During his hospital stay he has been using walker to ambulate to the bathroom. Requested PT eval. Order has been placed. Will follow for recommendations and further discharge needs.    Expected Discharge Plan: [place] Barriers to Discharge: Continued Medical Work up   Patient Goals and CMS Choice Patient states their goals for this hospitalization and ongoing recovery are:: return home      Expected Discharge Plan and Services Expected Discharge Plan: [place]       Living arrangements for the past 2 months: Single Family Home                                      Prior Living Arrangements/Services Living arrangements for the past 2 months: Single Family Home Lives with:: Spouse Patient language and need for interpreter reviewed:: Yes Do you feel safe going back to the place where you live?: Yes      Need for Family Participation in Patient Care: Yes (Comment) Care giver support system in place?: Yes (comment)   Criminal Activity/Legal Involvement Pertinent to Current Situation/Hospitalization: No - Comment as needed  Activities of Daily Living Home Assistive Devices/Equipment: None ADL Screening (condition at time of admission) Patient's cognitive ability adequate to safely complete daily activities?: Yes Is the patient deaf or have difficulty hearing?: No Does the patient have difficulty seeing, even when wearing glasses/contacts?: No Does the patient have difficulty concentrating,  remembering, or making decisions?: No Patient able to express need for assistance with ADLs?: Yes Does the patient have difficulty dressing or bathing?: No Independently performs ADLs?: Yes (appropriate for developmental age) Does the patient have difficulty walking or climbing stairs?: Yes Weakness of Legs: Right Weakness of Arms/Hands: None  Permission Sought/Granted                  Emotional Assessment Appearance:: Appears older than stated age Attitude/Demeanor/Rapport: Engaged Affect (typically observed): Accepting Orientation: : Oriented to Self, Oriented to Place, Oriented to  Time, Oriented to Situation Alcohol / Substance Use: Not Applicable Psych Involvement: No (comment)  Admission diagnosis:  Abscess [L02.91] ARF (acute renal failure) (HCC) [N17.9] Elevated serum creatinine [R79.89] Cellulitis of right lower extremity [L03.115] Acute renal failure, unspecified acute renal failure type [facility]) [N17.9] Patient Active Problem List   Diagnosis Date Noted   ARF (acute renal failure) ([place]) 10/30/2020   Cellulitis of right lower extremity 10/30/2020   Acute hyponatremia 10/30/2020   Hypokalemia 10/30/2020   High anion gap metabolic acidosis 46/27/0350   Hypertension    Tobacco abuse    Hyperparathyroidism, primary ([place]) 02/02/2016   PCP:  Hulan Fess, MD Pharmacy:   [facility], [address] [place] [place] [address] Phone: [phone] Fax: [phone]     Social Determinants of Health (SDOH) Interventions    Readmission Risk Interventions No flowsheet data found.

## 2020-11-05 DIAGNOSIS — N171 Acute kidney failure with acute cortical necrosis: Secondary | ICD-10-CM | POA: Diagnosis not present

## 2020-11-05 DIAGNOSIS — L03115 Cellulitis of right lower limb: Secondary | ICD-10-CM | POA: Diagnosis not present

## 2020-11-05 LAB — CBC
HCT: 29.6 % — ABNORMAL LOW (ref 39.0–52.0)
Hemoglobin: 9.7 g/dL — ABNORMAL LOW (ref 13.0–17.0)
MCH: 29.8 pg (ref 26.0–34.0)
MCHC: 32.8 g/dL (ref 30.0–36.0)
MCV: 91.1 fL (ref 80.0–100.0)
Platelets: 430 10*3/uL — ABNORMAL HIGH (ref 150–400)
RBC: 3.25 MIL/uL — ABNORMAL LOW (ref 4.22–5.81)
RDW: 14.1 % (ref 11.5–15.5)
WBC: 19.5 10*3/uL — ABNORMAL HIGH (ref 4.0–10.5)
nRBC: 0 % (ref 0.0–0.2)

## 2020-11-05 LAB — BASIC METABOLIC PANEL
Anion gap: 9 (ref 5–15)
BUN: 54 mg/dL — ABNORMAL HIGH (ref 8–23)
CO2: 26 mmol/L (ref 22–32)
Calcium: 8.4 mg/dL — ABNORMAL LOW (ref 8.9–10.3)
Chloride: 104 mmol/L (ref 98–111)
Creatinine, Ser: 2.69 mg/dL — ABNORMAL HIGH (ref 0.61–1.24)
GFR, Estimated: 26 mL/min — ABNORMAL LOW (ref >60)
Glucose, Bld: 120 mg/dL — ABNORMAL HIGH (ref 70–99)
Potassium: 4.6 mmol/L (ref 3.5–5.1)
Sodium: 139 mmol/L (ref 135–145)

## 2020-11-05 MED ORDER — HYDROCERIN EX CREA
TOPICAL_CREAM | Freq: Two times a day (BID) | CUTANEOUS | Status: DC
  Administered 2020-11-06 – 2020-11-08 (×2): 1 via TOPICAL
  Filled 2020-11-05: qty 113

## 2020-11-05 NOTE — Consult Note (Addendum)
[place] Nurse Consult Note: Patient receiving care in [facility] [phone] Reason for Consult: RLE cellulitis Wound type: RLE cellulitis Pressure Injury POA: NA Wound bed: Dry flaky skin on BLE with cellulitis which has improved since admission.  Drainage (amount, consistency, odor)  Dressing procedure/placement/frequency: Cleanse the BLE with soap and water, pat dry then apply Eucerin cream to the area. Apply twice daily.  Right LE Lateral   Posterior  Medial   Monitor the wound area(s) for worsening of condition such as: Signs/symptoms of infection, increase in size, development of or worsening of odor, development of pain, or increased pain at the affected locations.   Notify the medical team if any of these develop.  Pressure Injury Prevention Bundle May use any that apply to this patient. Support surfaces (air mattress) chair cushion Kellie Simmering # [phone]) Heel offloading boots Kellie Simmering # [phone]) Turning and Positioning  Measures to reduce shear (draw sheet, knees up) Skin protection Products (Foam dressing) Moisture management products (Critic-Aid Barrier Cream (Purple top) Sween moisturizing lotion (Pink top in clean supply) Nutrition Management Protection for Medical Devices Routine Skin Assessment   Thank you for the consult. [place] nurse will not follow at this time.   Please re-consult the [place] team if needed.  Cathlean Marseilles Tamala Julian, MSN, RN, [place], [place]le [place], [facility] Wound Treatment Associate Pager [phone]

## 2020-11-05 NOTE — Progress Notes (Signed)
Progress Note    Vincent Adams  KDT:[id number] DOB: [birth year]  DOA: 10/30/2020 PCP: Hulan Fess, MD    Brief Narrative:     Medical records reviewed and are as summarized below:  Vincent Adams is an 60 y.o. male with a past medical history significant for HTN, right LLE, and hyperparathyroidism who present w/ LLE pain w/ infection and AKI    Assessment/Plan:   Principal Problem:   ARF (acute renal failure) ([place]) Active Problems:   Cellulitis of right lower extremity   Acute hyponatremia   Hypokalemia   High anion gap metabolic acidosis   Hypertension   Tobacco abuse   ATN  Multifactorial: IV contrast  dehydration ,continued use of thiazide, ACE Saline locked by nephro replace bicarb orally 1.3 g twice daily Renal consult Recovering --likely will not need HD  Right lower extremity erythema and likely cellulitis -overall improved from admit [see earlier pictures] Not concerned regarding compartment syndrome -able to move LE's White count extremely elevated-likely form infeciton--no other source Continue ceftriaxone 1 g daily  HTN Was on HCTZ prior to admission in addition to quinapril  hold to allow for good renal perfusion  Hypokalemia improved  obesity Body mass index is 38.49 kg/m.   Family Communication/Anticipated D/C date and plan/Code Status   DVT prophylaxis: heparin Code Status: Full Code.   Disposition Plan: Status is: Inpatient  Remains inpatient appropriate because:Inpatient level of care appropriate due to severity of illness  Dispo: The patient is from: Home              Anticipated d/c is to: Home              Patient currently is not medically stable to d/c.   Difficult to place patient No         Medical Consultants:   renal  Subjective:   Having pins and needles pain in leg when up walking  Objective:    Vitals:   11/04/20 1953 11/04/20 2341 11/05/20 0333 11/05/20 0726  BP: 120/67 (!) 143/80 (!) 147/85  (!) 150/93  Pulse:  66 64 71  Resp: 19 13 14 17   Temp: 98.7 F (37.1 C) 98.9 F (37.2 C) 98.8 F (37.1 C) 99.3 F (37.4 C)  TempSrc: Oral Oral Oral Oral  SpO2:  96% 98% 91%  Weight:      Height:        Intake/Output Summary (Last 24 hours) at 11/05/2020 1351 Last data filed at 11/05/2020 9983 Gross per 24 hour  Intake 820 ml  Output 3850 ml  Net -3030 ml   Filed Weights   10/31/20 2130 11/01/20 0548  Weight: 132.9 kg 128.7 kg    Exam:  General: Appearance:    Obese male in no acute distress     Lungs:     respirations unlabored  Heart:    Normal heart rate.     MS:   All extremities are intact. Swelling in right LE, redness improving           Data Reviewed:   I have personally reviewed following labs and imaging studies:  Labs: Labs show the following:   Basic Metabolic Panel: Recent Labs  Lab 10/30/20 1610 10/30/20 1621 10/31/20 0411 11/01/20 0139 11/02/20 0050 11/03/20 0105 11/04/20 0114 11/05/20 0147  NA  --    < > 131* 135 134* 138 137 139  K  --    < > 3.3* 3.6 3.8 4.2 4.3 4.6  CL  --   --  98 101 102 104 104 104  CO2  --   --  14* 15* 18* 21* 24 26  GLUCOSE  --   --  113* 124* 177* 158* 173* 120*  BUN  --   --  134* 135* 126* 105* 77* 54*  CREATININE  --    < > 11.75* 10.20* 8.27* 5.45* 3.42* 2.69*  CALCIUM  --   --  7.7* 7.6* 7.6* 8.0* 7.9* 8.4*  MG 2.7*  --  2.6*  --   --   --   --   --   PHOS 7.1*  --  7.5* 7.4* 6.9* 5.6* 4.8*  --    < > = values in this interval not displayed.   GFR Estimated Creatinine Clearance: 40 mL/min (A) (by C-G formula based on SCr of 2.69 mg/dL (H)). Liver Function Tests: Recent Labs  Lab 10/30/20 1610 10/31/20 0411 11/01/20 0139 11/02/20 0050 11/03/20 0105 11/04/20 0114  AST 80*  --   --   --   --   --   ALT 142*  --   --   --   --   --   ALKPHOS 117  --   --   --   --   --   BILITOT 1.0  --   --   --   --   --   PROT 6.9  --   --   --   --   --   ALBUMIN 2.3* 2.2* 1.9* 2.0* 2.0* 2.0*   No  results for input(s): LIPASE, AMYLASE in the last 168 hours. No results for input(s): AMMONIA in the last 168 hours. Coagulation profile Recent Labs  Lab 10/30/20 1612 10/31/20 0411  INR 1.3* 1.3*    CBC: Recent Labs  Lab 10/30/20 1335 10/30/20 1621 10/31/20 0411 11/01/20 0139 11/02/20 0050 11/03/20 0105 11/05/20 0147  WBC 32.7*   < > 29.5* 27.8* 24.1* 22.6* 19.5*  NEUTROABS 31.7*  --  25.7* 21.8* 21.9* 20.6*  --   HGB 11.0*   < > 10.7* 9.6* 9.9* 9.8* 9.7*  HCT 31.9*   < > 31.3* 28.1* 29.1* 29.1* 29.6*  MCV 88.6   < > 88.7 88.6 87.9 88.7 91.1  PLT 359   < > 381 404* 455* 455* 430*   < > = values in this interval not displayed.   Cardiac Enzymes: Recent Labs  Lab 10/30/20 1610  CKTOTAL 288   BNP (last 3 results) No results for input(s): PROBNP in the last 8760 hours. CBG: No results for input(s): GLUCAP in the last 168 hours. D-Dimer: No results for input(s): DDIMER in the last 72 hours. Hgb A1c: No results for input(s): HGBA1C in the last 72 hours. Lipid Profile: No results for input(s): CHOL, HDL, LDLCALC, TRIG, CHOLHDL, LDLDIRECT in the last 72 hours. Thyroid function studies: No results for input(s): TSH, T4TOTAL, T3FREE, THYROIDAB in the last 72 hours.  Invalid input(s): FREET3 Anemia work up: Recent Labs    11/04/20 0114  FERRITIN 514*  TIBC 207*  IRON 30*   Sepsis Labs: Recent Labs  Lab 10/30/20 1610 10/30/20 1736 10/30/20 2225 11/01/20 0139 11/02/20 0050 11/03/20 0105 11/05/20 0147  WBC  --   --    < > 27.8* 24.1* 22.6* 19.5*  LATICACIDVEN 1.1 1.3  --   --   --   --   --    < > = values in this interval not displayed.  Microbiology Recent Results (from the past 240 hour(s))  Blood culture (routine x 2)     Status: None   Collection Time: 10/30/20  3:36 PM   Specimen: BLOOD  Result Value Ref Range Status   Specimen Description BLOOD RIGHT ANTECUBITAL  Final   Special Requests   Final    BOTTLES DRAWN AEROBIC AND ANAEROBIC Blood  Culture adequate volume   Culture   Final    NO GROWTH 5 DAYS Performed at [facility], [address], [place], [place] [address]    Report Status 11/04/2020 FINAL  Final  Blood culture (routine x 2)     Status: None   Collection Time: 10/30/20  4:10 PM   Specimen: BLOOD  Result Value Ref Range Status   Specimen Description BLOOD RIGHT ANTECUBITAL  Final   Special Requests   Final    BOTTLES DRAWN AEROBIC AND ANAEROBIC Blood Culture results may not be optimal due to an excessive volume of blood received in culture bottles   Culture   Final    NO GROWTH 5 DAYS Performed at [facility], [place] [address], [place], [place] [address]    Report Status 11/04/2020 FINAL  Final  SARS CORONAVIRUS 2 (TAT 6-24 HRS) Nasopharyngeal Nasopharyngeal Swab     Status: None   Collection Time: 10/30/20  4:49 PM   Specimen: Nasopharyngeal Swab  Result Value Ref Range Status   SARS Coronavirus 2 NEGATIVE NEGATIVE Final    Comment: (NOTE) SARS-CoV-2 target nucleic acids are NOT DETECTED.  The SARS-CoV-2 RNA is generally detectable in upper and lower respiratory specimens during the acute phase of infection. Negative results do not preclude SARS-CoV-2 infection, do not rule out co-infections with other pathogens, and should not be used as the sole basis for treatment or other patient management decisions. Negative results must be combined with clinical observations, patient history, and epidemiological information. The expected result is Negative.  Fact Sheet for Patients: SugarRoll.be  Fact Sheet for Healthcare Providers: https://www.woods-mathews.com/  This test is not yet approved or cleared by the [place] FDA and  has been authorized for detection and/or diagnosis of SARS-CoV-2 by FDA under an Emergency Use Authorization (EUA). This EUA will remain  in effect (meaning this test can be used) for the duration of the COVID-19 declaration  under Se ction 564(b)(1) of the Act, 21 U.S.C. section 360bbb-3(b)(1), unless the authorization is terminated or revoked sooner.  Performed at [facility], [place] [address], [place], [place] [address]     Procedures and diagnostic studies:  No results found.  Medications:    amLODipine  10 mg Oral Daily   aspirin  325 mg Oral Daily   doxazosin  8 mg Oral Daily   heparin  5,000 Units Subcutaneous Q8H   HYDROcodone-acetaminophen  1 tablet Oral Q6H   pravastatin  40 mg Oral Daily   Continuous Infusions:  cefTRIAXone (ROCEPHIN)  IV 1 g (11/04/20 1431)     LOS: 6 days   Geradine Girt  [facility]   How to contact the [facility] Attending or Consulting provider [place] or covering provider during after hours [place], for this patient?  Check the care team in [facility] and look for a) attending/consulting TRH provider listed and b) the [facility] team listed Log into [email or web address] and use [place]'s universal password to access. If you do not have the password, please contact the hospital operator. Locate the [facility] provider you are looking for  under [facility] and page to a number that you can be directly reached. If you still have difficulty reaching the provider, please page the [facility] (Director on Call) for the Hospitalists listed on amion for assistance.  11/05/2020, 1:51 PM

## 2020-11-05 NOTE — Evaluation (Signed)
Physical Therapy Evaluation and Discharge Patient Details Name: Vincent Adams MRN: [id number] DOB: 1960-08-20 Today's Date: 11/05/2020  History of Present Illness  60 y.o. male with medical history significant for essential hypertension, chronic tobacco abuse, who is admitted to [facility] on 10/30/2020 with acute renal failure after presenting from home to [facility] ED complaining of right lower extremity erythema. +cellulitis  Clinical Impression   Patient evaluated by Physical Therapy with no further acute PT needs identified. All education has been completed and the patient has no further questions. PT is signing off. Thank you for this referral.        Recommendations for follow up therapy are one component of a multi-disciplinary discharge planning process, led by the attending physician.  Recommendations may be updated based on patient status, additional functional criteria and insurance authorization.  Follow Up Recommendations No PT follow up    Equipment Recommendations  Other (comment) (pt reports can borrow a RW from family)    Recommendations for Other Services       Precautions / Restrictions Precautions Precautions: Fall Restrictions Weight Bearing Restrictions: No      Mobility  Bed Mobility Overal bed mobility: Independent                  Transfers Overall transfer level: Needs assistance Equipment used: Rolling walker (2 wheeled) Transfers: Sit to/from Stand Sit to Stand: Supervision;Modified independent (Device/Increase time)         General transfer comment: vc for safe use of RW (pt able to demonstrate safely with cues only)  Ambulation/Gait Ambulation/Gait assistance: Supervision;Modified independent (Device/Increase time) Gait Distance (Feet): 30 Feet Assistive device: Rolling walker (2 wheeled) Gait Pattern/deviations: Step-to pattern Gait velocity: slow due to pain   General Gait Details: educated in leading with RLE, pushing  thru UEs and then advancing LLE. Pt quickly return demonstrated proper technique  Stairs Stairs:  (Discussed can carry walker in opposite hand going up/down steps vs using cane on steps (however when get upstairs to go to restroom will only have cane with him). Pt reports he prefers to use cane on steps as he does not have far to walk to get to BR)          Wheelchair Mobility    Modified Rankin (Stroke Patients Only)       Balance Overall balance assessment: Modified Independent                                           Pertinent Vitals/Pain Pain Assessment: 0-10 Pain Score: 4  Pain Location: RLE Pain Descriptors / Indicators: Pins and needles Pain Intervention(s): Limited activity within patient's tolerance;Monitored during session    Home Living Family/patient expects to be discharged to:: Private residence Living Arrangements: Spouse/significant other Available Help at Discharge: Family Type of Home: House Home Access: Level entry     Home Layout: Two level;Bed/bath upstairs (enters into basement level (no bathroom on this level)) Home Equipment: Cane - single point (states can borrow RW from family members (multiple options))      Prior Function Level of Independence: Independent with assistive device(s)         Comments: began using cane PTA due to pain in RLE     Hand Dominance        Extremity/Trunk Assessment   Upper Extremity Assessment Upper Extremity Assessment: Overall WFL for tasks assessed  Lower Extremity Assessment Lower Extremity Assessment: RLE deficits/detail RLE Deficits / Details: ++edema; AROM WFL    Cervical / Trunk Assessment Cervical / Trunk Assessment: Other exceptions Cervical / Trunk Exceptions: overweight  Communication   Communication: No difficulties  Cognition Arousal/Alertness: Awake/alert Behavior During Therapy: WFL for tasks assessed/performed Overall Cognitive Status: Within Functional  Limits for tasks assessed                                        General Comments General comments (skin integrity, edema, etc.): Educated on proper elevation, removing sock when not up walking and discussed possible need for shower seat (which he denies need of). Educated on stepping over tub at end wall and holding onto wall.    Exercises     Assessment/Plan    PT Assessment Patent does not need any further PT services  PT Problem List         PT Treatment Interventions      PT Goals (Current goals can be found in the Care Plan section)  Acute Rehab PT Goals Patient Stated Goal: leg to get better PT Goal Formulation: All assessment and education complete, DC therapy    Frequency     Barriers to discharge        Co-evaluation               AM-PAC PT "6 Clicks" Mobility  Outcome Measure Help needed turning from your back to your side while in a flat bed without using bedrails?: None Help needed moving from lying on your back to sitting on the side of a flat bed without using bedrails?: None Help needed moving to and from a bed to a chair (including a wheelchair)?: None Help needed standing up from a chair using your arms (e.g., wheelchair or bedside chair)?: None Help needed to walk in hospital room?: None Help needed climbing 3-5 steps with a railing? : A Little 6 Click Score: 23    End of Session   Activity Tolerance: Patient tolerated treatment well Patient left: in bed;with call bell/phone within reach   PT Visit Diagnosis: Difficulty in walking, not elsewhere classified (R26.2)    Time: 4967-5916 PT Time Calculation (min) (ACUTE ONLY): 14 min   Charges:   PT Evaluation $PT Eval Low Complexity: 1 Low           Arby Barrette, PT Pager [phone]   Rexanne Mano 11/05/2020, 10:06 AM

## 2020-11-06 DIAGNOSIS — N171 Acute kidney failure with acute cortical necrosis: Secondary | ICD-10-CM | POA: Diagnosis not present

## 2020-11-06 DIAGNOSIS — L03115 Cellulitis of right lower limb: Secondary | ICD-10-CM | POA: Diagnosis not present

## 2020-11-06 DIAGNOSIS — I10 Essential (primary) hypertension: Secondary | ICD-10-CM | POA: Diagnosis not present

## 2020-11-06 LAB — CBC
HCT: 30.8 % — ABNORMAL LOW (ref 39.0–52.0)
Hemoglobin: 9.9 g/dL — ABNORMAL LOW (ref 13.0–17.0)
MCH: 29.6 pg (ref 26.0–34.0)
MCHC: 32.1 g/dL (ref 30.0–36.0)
MCV: 92.2 fL (ref 80.0–100.0)
Platelets: 439 10*3/uL — ABNORMAL HIGH (ref 150–400)
RBC: 3.34 MIL/uL — ABNORMAL LOW (ref 4.22–5.81)
RDW: 14 % (ref 11.5–15.5)
WBC: 16.6 10*3/uL — ABNORMAL HIGH (ref 4.0–10.5)
nRBC: 0 % (ref 0.0–0.2)

## 2020-11-06 LAB — BASIC METABOLIC PANEL
Anion gap: 8 (ref 5–15)
BUN: 39 mg/dL — ABNORMAL HIGH (ref 8–23)
CO2: 26 mmol/L (ref 22–32)
Calcium: 8.5 mg/dL — ABNORMAL LOW (ref 8.9–10.3)
Chloride: 104 mmol/L (ref 98–111)
Creatinine, Ser: 2.16 mg/dL — ABNORMAL HIGH (ref 0.61–1.24)
GFR, Estimated: 34 mL/min — ABNORMAL LOW (ref >60)
Glucose, Bld: 141 mg/dL — ABNORMAL HIGH (ref 70–99)
Potassium: 4.6 mmol/L (ref 3.5–5.1)
Sodium: 138 mmol/L (ref 135–145)

## 2020-11-06 LAB — PROTEIN ELECTROPHORESIS, SERUM
A/G Ratio: 0.5 — ABNORMAL LOW (ref 0.7–1.7)
Albumin ELP: 2.3 g/dL — ABNORMAL LOW (ref 2.9–4.4)
Alpha-1-Globulin: 0.4 g/dL (ref 0.0–0.4)
Alpha-2-Globulin: 1.1 g/dL — ABNORMAL HIGH (ref 0.4–1.0)
Beta Globulin: 0.9 g/dL (ref 0.7–1.3)
Gamma Globulin: 1.9 g/dL — ABNORMAL HIGH (ref 0.4–1.8)
Globulin, Total: 4.2 g/dL — ABNORMAL HIGH (ref 2.2–3.9)
Total Protein ELP: 6.5 g/dL (ref 6.0–8.5)

## 2020-11-06 MED ORDER — METOPROLOL SUCCINATE ER 100 MG PO TB24
100.0000 mg | ORAL_TABLET | Freq: Every day | ORAL | Status: DC
  Administered 2020-11-06 – 2020-11-09 (×4): 100 mg via ORAL
  Filled 2020-11-06 (×4): qty 1

## 2020-11-06 NOTE — Progress Notes (Signed)
Progress Note    Vincent Adams  YHC:[id number] DOB: October 16, 1960  DOA: 10/30/2020 PCP: Hulan Fess, MD    Brief Narrative:     Medical records reviewed and are as summarized below:  Vincent Adams is an 60 y.o. male with a past medical history significant for HTN, right LLE, and hyperparathyroidism who present w/ LLE pain w/ infection and AKI    Assessment/Plan:   Principal Problem:   ARF (acute renal failure) ([facility]) Active Problems:   Cellulitis of right lower extremity   Acute hyponatremia   Hypokalemia   High anion gap metabolic acidosis   Hypertension   Tobacco abuse   ATN  Multifactorial: IV contrast  dehydration ,continued use of thiazide, ACE Renal consult appreciated-- they have signed off Recovering --likely will not need HD  Right lower extremity erythema and likely cellulitis -overall improved from admit [see earlier pictures] Not concerned regarding compartment syndrome -able to move LE's White count extremely elevated-likely form infeciton--no other source but trending down Continue ceftriaxone 1 g daily  HTN Was on HCTZ prior to admission in addition to quinapril  hold to allow for good renal perfusion -resume Toprol XL  Hypokalemia improved  obesity Body mass index is 38.49 kg/m.   Family Communication/Anticipated D/C date and plan/Code Status   DVT prophylaxis: heparin Code Status: Full Code.   Disposition Plan: Status is: Inpatient  Remains inpatient appropriate because:Inpatient level of care appropriate due to severity of illness  Dispo: The patient is from: Home              Anticipated d/c is to: Home              Patient currently is not medically stable to d/c. 24-48 more hours   Difficult to place patient No         Medical Consultants:   renal  Subjective:   Pain only when putting weight on leg  Objective:    Vitals:   11/05/20 2118 11/06/20 0000 11/06/20 0446 11/06/20 0815  BP: (!) 151/80 (!) 142/83  137/90 (!) 163/79  Pulse: 73 69 65 71  Resp: 18 18 14 16   Temp: 99.3 F (37.4 C) 99.3 F (37.4 C) (!) 97.5 F (36.4 C) 99.2 F (37.3 C)  TempSrc: Oral Oral Oral Oral  SpO2: 96% 96% 97% 95%  Weight:      Height:        Intake/Output Summary (Last 24 hours) at 11/06/2020 0850 Last data filed at 11/06/2020 0247 Gross per 24 hour  Intake 249.69 ml  Output 950 ml  Net -700.31 ml   Filed Weights   10/31/20 2130 11/01/20 0548  Weight: 132.9 kg 128.7 kg    Exam:   General: Appearance:    Obese male in no acute distress     Lungs:     respirations unlabored  Heart:    Normal heart rate.    MS:   All extremities are intact. Right leg with decreased swelling   Neurologic:   Awake, alert, oriented x 3. No apparent focal neurological           defect.      Data Reviewed:   I have personally reviewed following labs and imaging studies:  Labs: Labs show the following:   Basic Metabolic Panel: Recent Labs  Lab 10/30/20 1610 10/30/20 1621 10/31/20 0411 11/01/20 0139 11/02/20 0050 11/03/20 0105 11/04/20 0114 11/05/20 0147 11/06/20 0116  NA  --    < >  131* 135 134* 138 137 139 138  K  --    < > 3.3* 3.6 3.8 4.2 4.3 4.6 4.6  CL  --   --  98 101 102 104 104 104 104  CO2  --   --  14* 15* 18* 21* 24 26 26   GLUCOSE  --   --  113* 124* 177* 158* 173* 120* 141*  BUN  --   --  134* 135* 126* 105* 77* 54* 39*  CREATININE  --    < > 11.75* 10.20* 8.27* 5.45* 3.42* 2.69* 2.16*  CALCIUM  --   --  7.7* 7.6* 7.6* 8.0* 7.9* 8.4* 8.5*  MG 2.7*  --  2.6*  --   --   --   --   --   --   PHOS 7.1*  --  7.5* 7.4* 6.9* 5.6* 4.8*  --   --    < > = values in this interval not displayed.   GFR Estimated Creatinine Clearance: 49.8 mL/min (A) (by C-G formula based on SCr of 2.16 mg/dL (H)). Liver Function Tests: Recent Labs  Lab 10/30/20 1610 10/31/20 0411 11/01/20 0139 11/02/20 0050 11/03/20 0105 11/04/20 0114  AST 80*  --   --   --   --   --   ALT 142*  --   --   --   --   --    ALKPHOS 117  --   --   --   --   --   BILITOT 1.0  --   --   --   --   --   PROT 6.9  --   --   --   --   --   ALBUMIN 2.3* 2.2* 1.9* 2.0* 2.0* 2.0*   No results for input(s): LIPASE, AMYLASE in the last 168 hours. No results for input(s): AMMONIA in the last 168 hours. Coagulation profile Recent Labs  Lab 10/30/20 1612 10/31/20 0411  INR 1.3* 1.3*    CBC: Recent Labs  Lab 10/30/20 1335 10/30/20 1621 10/31/20 0411 11/01/20 0139 11/02/20 0050 11/03/20 0105 11/05/20 0147 11/06/20 0116  WBC 32.7*   < > 29.5* 27.8* 24.1* 22.6* 19.5* 16.6*  NEUTROABS 31.7*  --  25.7* 21.8* 21.9* 20.6*  --   --   HGB 11.0*   < > 10.7* 9.6* 9.9* 9.8* 9.7* 9.9*  HCT 31.9*   < > 31.3* 28.1* 29.1* 29.1* 29.6* 30.8*  MCV 88.6   < > 88.7 88.6 87.9 88.7 91.1 92.2  PLT 359   < > 381 404* 455* 455* 430* 439*   < > = values in this interval not displayed.   Cardiac Enzymes: Recent Labs  Lab 10/30/20 1610  CKTOTAL 288   BNP (last 3 results) No results for input(s): PROBNP in the last 8760 hours. CBG: No results for input(s): GLUCAP in the last 168 hours. D-Dimer: No results for input(s): DDIMER in the last 72 hours. Hgb A1c: No results for input(s): HGBA1C in the last 72 hours. Lipid Profile: No results for input(s): CHOL, HDL, LDLCALC, TRIG, CHOLHDL, LDLDIRECT in the last 72 hours. Thyroid function studies: No results for input(s): TSH, T4TOTAL, T3FREE, THYROIDAB in the last 72 hours.  Invalid input(s): FREET3 Anemia work up: Recent Labs    11/04/20 0114  FERRITIN 514*  TIBC 207*  IRON 30*   Sepsis Labs: Recent Labs  Lab 10/30/20 1610 10/30/20 1736 10/30/20 2225 11/02/20 0050 11/03/20 0105 11/05/20 0147 11/06/20 0116  WBC  --   --    < >  24.1* 22.6* 19.5* 16.6*  LATICACIDVEN 1.1 1.3  --   --   --   --   --    < > = values in this interval not displayed.    Microbiology Recent Results (from the past 240 hour(s))  Blood culture (routine x 2)     Status: None    Collection Time: 10/30/20  3:36 PM   Specimen: BLOOD  Result Value Ref Range Status   Specimen Description BLOOD RIGHT ANTECUBITAL  Final   Special Requests   Final    BOTTLES DRAWN AEROBIC AND ANAEROBIC Blood Culture adequate volume   Culture   Final    NO GROWTH 5 DAYS Performed at [facility], [address], [place], [place] [address]    Report Status 11/04/2020 FINAL  Final  Blood culture (routine x 2)     Status: None   Collection Time: 10/30/20  4:10 PM   Specimen: BLOOD  Result Value Ref Range Status   Specimen Description BLOOD RIGHT ANTECUBITAL  Final   Special Requests   Final    BOTTLES DRAWN AEROBIC AND ANAEROBIC Blood Culture results may not be optimal due to an excessive volume of blood received in culture bottles   Culture   Final    NO GROWTH 5 DAYS Performed at [facility], [place] [address], [place], [place] [address]    Report Status 11/04/2020 FINAL  Final  SARS CORONAVIRUS 2 (TAT 6-24 HRS) Nasopharyngeal Nasopharyngeal Swab     Status: None   Collection Time: 10/30/20  4:49 PM   Specimen: Nasopharyngeal Swab  Result Value Ref Range Status   SARS Coronavirus 2 NEGATIVE NEGATIVE Final    Comment: (NOTE) SARS-CoV-2 target nucleic acids are NOT DETECTED.  The SARS-CoV-2 RNA is generally detectable in upper and lower respiratory specimens during the acute phase of infection. Negative results do not preclude SARS-CoV-2 infection, do not rule out co-infections with other pathogens, and should not be used as the sole basis for treatment or other patient management decisions. Negative results must be combined with clinical observations, patient history, and epidemiological information. The expected result is Negative.  Fact Sheet for Patients: SugarRoll.be  Fact Sheet for Healthcare Providers: https://www.woods-mathews.com/  This test is not yet approved or cleared by the [place] FDA and  has been  authorized for detection and/or diagnosis of SARS-CoV-2 by FDA under an Emergency Use Authorization (EUA). This EUA will remain  in effect (meaning this test can be used) for the duration of the COVID-19 declaration under Se ction 564(b)(1) of the Act, 21 U.S.C. section 360bbb-3(b)(1), unless the authorization is terminated or revoked sooner.  Performed at [facility], [place] [address], [place], [place] [address]     Procedures and diagnostic studies:  No results found.  Medications:    amLODipine  10 mg Oral Daily   aspirin  325 mg Oral Daily   doxazosin  8 mg Oral Daily   heparin  5,000 Units Subcutaneous Q8H   hydrocerin   Topical BID   HYDROcodone-acetaminophen  1 tablet Oral Q6H   pravastatin  40 mg Oral Daily   Continuous Infusions:  cefTRIAXone (ROCEPHIN)  IV 1 g (11/05/20 1351)     LOS: 7 days   Geradine Girt  [facility]   How to contact the [facility] Attending or Consulting provider [place] or covering provider during after hours [place], for this patient?  Check the care team in [facility] and  look for a) attending/consulting TRH provider listed and b) the [facility] team listed Log into [email or web address] and use [place]'s universal password to access. If you do not have the password, please contact the hospital operator. Locate the [facility] provider you are looking for under [facility] and page to a number that you can be directly reached. If you still have difficulty reaching the provider, please page the [facility] (Director on Call) for the Hospitalists listed on amion for assistance.  11/06/2020, 8:50 AM

## 2020-11-07 ENCOUNTER — Encounter (HOSPITAL_COMMUNITY): Payer: Managed Care, Other (non HMO)

## 2020-11-07 DIAGNOSIS — L03115 Cellulitis of right lower limb: Secondary | ICD-10-CM | POA: Diagnosis not present

## 2020-11-07 DIAGNOSIS — N171 Acute kidney failure with acute cortical necrosis: Secondary | ICD-10-CM | POA: Diagnosis not present

## 2020-11-07 LAB — BASIC METABOLIC PANEL
Anion gap: 7 (ref 5–15)
BUN: 31 mg/dL — ABNORMAL HIGH (ref 8–23)
CO2: 23 mmol/L (ref 22–32)
Calcium: 8.4 mg/dL — ABNORMAL LOW (ref 8.9–10.3)
Chloride: 107 mmol/L (ref 98–111)
Creatinine, Ser: 1.93 mg/dL — ABNORMAL HIGH (ref 0.61–1.24)
GFR, Estimated: 39 mL/min — ABNORMAL LOW (ref >60)
Glucose, Bld: 181 mg/dL — ABNORMAL HIGH (ref 70–99)
Potassium: 4.6 mmol/L (ref 3.5–5.1)
Sodium: 137 mmol/L (ref 135–145)

## 2020-11-07 LAB — CBC
HCT: 30.8 % — ABNORMAL LOW (ref 39.0–52.0)
Hemoglobin: 9.5 g/dL — ABNORMAL LOW (ref 13.0–17.0)
MCH: 29.4 pg (ref 26.0–34.0)
MCHC: 30.8 g/dL (ref 30.0–36.0)
MCV: 95.4 fL (ref 80.0–100.0)
Platelets: 415 10*3/uL — ABNORMAL HIGH (ref 150–400)
RBC: 3.23 MIL/uL — ABNORMAL LOW (ref 4.22–5.81)
RDW: 14 % (ref 11.5–15.5)
WBC: 15.6 10*3/uL — ABNORMAL HIGH (ref 4.0–10.5)
nRBC: 0 % (ref 0.0–0.2)

## 2020-11-07 NOTE — Progress Notes (Signed)
Progress Note    Vincent Adams  EOF:[id number] DOB: [birth year]  DOA: 10/30/2020 PCP: Hulan Fess, MD    Brief Narrative:     Medical records reviewed and are as summarized below:  Vincent Adams is an 60 y.o. male with a past medical history significant for HTN, right LLE, and hyperparathyroidism who present w/ LLE pain w/ infection and AKI.   Slow to improve.   Assessment/Plan:   Principal Problem:   ARF (acute renal failure) ([facility]) Active Problems:   Cellulitis of right lower extremity   Acute hyponatremia   Hypokalemia   High anion gap metabolic acidosis   Hypertension   Tobacco abuse   ATN  Multifactorial: IV contrast  dehydration ,continued use of thiazide, ACE Renal consult appreciated-- they have signed off Recovering --likely will not need HD  Right lower extremity erythema and likely cellulitis -overall improved from admit [see earlier pictures] but still with marked swelling Not concerned regarding compartment syndrome -able to move LE's White count extremely elevated-likely form infeciton--no other source but trending down Continue ceftriaxone 1 g daily -r/o blood clot with duplex  HTN Was on HCTZ prior to admission in addition to quinapril  hold to allow for good renal perfusion -resume Toprol XL  Hypokalemia improved  obesity Body mass index is 38.49 kg/m.   Family Communication/Anticipated D/C date and plan/Code Status   DVT prophylaxis: heparin Code Status: Full Code.   Disposition Plan: Status is: Inpatient  Remains inpatient appropriate because:Inpatient level of care appropriate due to severity of illness  Dispo: The patient is from: Home              Anticipated d/c is to: Home              Patient currently is not medically stable to d/c. 24-48 more hours   Difficult to place patient No         Medical Consultants:   renal  Subjective:   Did have prior damage to right leg but never has difference in size of  legs  Objective:    Vitals:   11/07/20 0053 11/07/20 0327 11/07/20 0756 11/07/20 1217  BP: (!) 148/78 (!) 158/86 (!) 148/85 124/72  Pulse: 81 67 63 64  Resp: 18 16 18 17   Temp: 99.1 F (37.3 C) 99 F (37.2 C) 99 F (37.2 C) 98.8 F (37.1 C)  TempSrc: Oral Oral Oral Oral  SpO2: 94% 95% 91% 91%  Weight:      Height:        Intake/Output Summary (Last 24 hours) at 11/07/2020 1225 Last data filed at 11/07/2020 0100 Gross per 24 hour  Intake 240 ml  Output 450 ml  Net -210 ml   Filed Weights   10/31/20 2130 11/01/20 0548  Weight: 132.9 kg 128.7 kg    Exam:   General: Appearance:    Obese male in no acute distress     Lungs:     respirations unlabored  Heart:    Normal heart rate. Normal rhythm. No murmurs, rubs, or gallops.    MS:   All extremities are intact. Right leg swollen and has some chronic changes to skin   Neurologic:   Awake, alert, oriented x 3. No apparent focal neurological           defect.        Data Reviewed:   I have personally reviewed following labs and imaging studies:  Labs: Labs show the following:  Basic Metabolic Panel: Recent Labs  Lab 11/01/20 0139 11/02/20 0050 11/03/20 0105 11/04/20 0114 11/05/20 0147 11/06/20 0116 11/07/20 0131  NA 135 134* 138 137 139 138 137  K 3.6 3.8 4.2 4.3 4.6 4.6 4.6  CL 101 102 104 104 104 104 107  CO2 15* 18* 21* 24 26 26 23   GLUCOSE 124* 177* 158* 173* 120* 141* 181*  BUN 135* 126* 105* 77* 54* 39* 31*  CREATININE 10.20* 8.27* 5.45* 3.42* 2.69* 2.16* 1.93*  CALCIUM 7.6* 7.6* 8.0* 7.9* 8.4* 8.5* 8.4*  PHOS 7.4* 6.9* 5.6* 4.8*  --   --   --    GFR Estimated Creatinine Clearance: 55.7 mL/min (A) (by C-G formula based on SCr of 1.93 mg/dL (H)). Liver Function Tests: Recent Labs  Lab 11/01/20 0139 11/02/20 0050 11/03/20 0105 11/04/20 0114  ALBUMIN 1.9* 2.0* 2.0* 2.0*   No results for input(s): LIPASE, AMYLASE in the last 168 hours. No results for input(s): AMMONIA in the last 168  hours. Coagulation profile No results for input(s): INR, PROTIME in the last 168 hours.   CBC: Recent Labs  Lab 11/01/20 0139 11/02/20 0050 11/03/20 0105 11/05/20 0147 11/06/20 0116 11/07/20 0131  WBC 27.8* 24.1* 22.6* 19.5* 16.6* 15.6*  NEUTROABS 21.8* 21.9* 20.6*  --   --   --   HGB 9.6* 9.9* 9.8* 9.7* 9.9* 9.5*  HCT 28.1* 29.1* 29.1* 29.6* 30.8* 30.8*  MCV 88.6 87.9 88.7 91.1 92.2 95.4  PLT 404* 455* 455* 430* 439* 415*   Cardiac Enzymes: No results for input(s): CKTOTAL, CKMB, CKMBINDEX, TROPONINI in the last 168 hours.  BNP (last 3 results) No results for input(s): PROBNP in the last 8760 hours. CBG: No results for input(s): GLUCAP in the last 168 hours. D-Dimer: No results for input(s): DDIMER in the last 72 hours. Hgb A1c: No results for input(s): HGBA1C in the last 72 hours. Lipid Profile: No results for input(s): CHOL, HDL, LDLCALC, TRIG, CHOLHDL, LDLDIRECT in the last 72 hours. Thyroid function studies: No results for input(s): TSH, T4TOTAL, T3FREE, THYROIDAB in the last 72 hours.  Invalid input(s): FREET3 Anemia work up: No results for input(s): VITAMINB12, FOLATE, FERRITIN, TIBC, IRON, RETICCTPCT in the last 72 hours.  Sepsis Labs: Recent Labs  Lab 11/03/20 0105 11/05/20 0147 11/06/20 0116 11/07/20 0131  WBC 22.6* 19.5* 16.6* 15.6*    Microbiology Recent Results (from the past 240 hour(s))  Blood culture (routine x 2)     Status: None   Collection Time: 10/30/20  3:36 PM   Specimen: BLOOD  Result Value Ref Range Status   Specimen Description BLOOD RIGHT ANTECUBITAL  Final   Special Requests   Final    BOTTLES DRAWN AEROBIC AND ANAEROBIC Blood Culture adequate volume   Culture   Final    NO GROWTH 5 DAYS Performed at [facility], [address], [place], [place] [address]    Report Status 11/04/2020 FINAL  Final  Blood culture (routine x 2)     Status: None   Collection Time: 10/30/20  4:10 PM   Specimen: BLOOD  Result Value Ref  Range Status   Specimen Description BLOOD RIGHT ANTECUBITAL  Final   Special Requests   Final    BOTTLES DRAWN AEROBIC AND ANAEROBIC Blood Culture results may not be optimal due to an excessive volume of blood received in culture bottles   Culture   Final    NO GROWTH 5 DAYS Performed at [facility], [place] [address]  [place], [place] [address]    Report Status 11/04/2020 FINAL  Final  SARS CORONAVIRUS 2 (TAT 6-24 HRS) Nasopharyngeal Nasopharyngeal Swab     Status: None   Collection Time: 10/30/20  4:49 PM   Specimen: Nasopharyngeal Swab  Result Value Ref Range Status   SARS Coronavirus 2 NEGATIVE NEGATIVE Final    Comment: (NOTE) SARS-CoV-2 target nucleic acids are NOT DETECTED.  The SARS-CoV-2 RNA is generally detectable in upper and lower respiratory specimens during the acute phase of infection. Negative results do not preclude SARS-CoV-2 infection, do not rule out co-infections with other pathogens, and should not be used as the sole basis for treatment or other patient management decisions. Negative results must be combined with clinical observations, patient history, and epidemiological information. The expected result is Negative.  Fact Sheet for Patients: SugarRoll.be  Fact Sheet for Healthcare Providers: https://www.woods-mathews.com/  This test is not yet approved or cleared by the [place] FDA and  has been authorized for detection and/or diagnosis of SARS-CoV-2 by FDA under an Emergency Use Authorization (EUA). This EUA will remain  in effect (meaning this test can be used) for the duration of the COVID-19 declaration under Se ction 564(b)(1) of the Act, 21 U.S.C. section 360bbb-3(b)(1), unless the authorization is terminated or revoked sooner.  Performed at [facility], [place] [address], [place], [place] [address]     Procedures and diagnostic studies:  No results found.  Medications:    amLODipine   10 mg Oral Daily   aspirin  325 mg Oral Daily   doxazosin  8 mg Oral Daily   heparin  5,000 Units Subcutaneous Q8H   hydrocerin   Topical BID   HYDROcodone-acetaminophen  1 tablet Oral Q6H   metoprolol succinate  100 mg Oral Daily   pravastatin  40 mg Oral Daily   Continuous Infusions:  cefTRIAXone (ROCEPHIN)  IV 1 g (11/06/20 1502)     LOS: 8 days   Geradine Girt  [facility]   How to contact the [facility] Attending or Consulting provider [place] or covering provider during after hours [place], for this patient?  Check the care team in [facility] and look for a) attending/consulting TRH provider listed and b) the [facility] team listed Log into [email or web address] and use [place]'s universal password to access. If you do not have the password, please contact the hospital operator. Locate the [facility] provider you are looking for under [facility] and page to a number that you can be directly reached. If you still have difficulty reaching the provider, please page the [facility] (Director on Call) for the Hospitalists listed on amion for assistance.  11/07/2020, 12:25 PM

## 2020-11-08 ENCOUNTER — Inpatient Hospital Stay (HOSPITAL_COMMUNITY): Payer: Managed Care, Other (non HMO)

## 2020-11-08 DIAGNOSIS — L03115 Cellulitis of right lower limb: Secondary | ICD-10-CM | POA: Diagnosis not present

## 2020-11-08 DIAGNOSIS — R609 Edema, unspecified: Secondary | ICD-10-CM

## 2020-11-08 DIAGNOSIS — N171 Acute kidney failure with acute cortical necrosis: Secondary | ICD-10-CM | POA: Diagnosis not present

## 2020-11-08 DIAGNOSIS — L538 Other specified erythematous conditions: Secondary | ICD-10-CM

## 2020-11-08 DIAGNOSIS — L0390 Cellulitis, unspecified: Secondary | ICD-10-CM

## 2020-11-08 LAB — CBC
HCT: 29.3 % — ABNORMAL LOW (ref 39.0–52.0)
Hemoglobin: 9.3 g/dL — ABNORMAL LOW (ref 13.0–17.0)
MCH: 29.8 pg (ref 26.0–34.0)
MCHC: 31.7 g/dL (ref 30.0–36.0)
MCV: 93.9 fL (ref 80.0–100.0)
Platelets: 401 10*3/uL — ABNORMAL HIGH (ref 150–400)
RBC: 3.12 MIL/uL — ABNORMAL LOW (ref 4.22–5.81)
RDW: 13.9 % (ref 11.5–15.5)
WBC: 11.1 10*3/uL — ABNORMAL HIGH (ref 4.0–10.5)
nRBC: 0 % (ref 0.0–0.2)

## 2020-11-08 LAB — BASIC METABOLIC PANEL
Anion gap: 7 (ref 5–15)
BUN: 27 mg/dL — ABNORMAL HIGH (ref 8–23)
CO2: 24 mmol/L (ref 22–32)
Calcium: 8.3 mg/dL — ABNORMAL LOW (ref 8.9–10.3)
Chloride: 104 mmol/L (ref 98–111)
Creatinine, Ser: 1.71 mg/dL — ABNORMAL HIGH (ref 0.61–1.24)
GFR, Estimated: 45 mL/min — ABNORMAL LOW (ref >60)
Glucose, Bld: 195 mg/dL — ABNORMAL HIGH (ref 70–99)
Potassium: 4.6 mmol/L (ref 3.5–5.1)
Sodium: 135 mmol/L (ref 135–145)

## 2020-11-08 MED ORDER — DOXYCYCLINE HYCLATE 100 MG PO TABS
100.0000 mg | ORAL_TABLET | Freq: Two times a day (BID) | ORAL | Status: DC
  Administered 2020-11-08 – 2020-11-09 (×3): 100 mg via ORAL
  Filled 2020-11-08 (×3): qty 1

## 2020-11-08 NOTE — Progress Notes (Signed)
VASCULAR LAB   Right lower extremity venous duplex has been performed.  See CV proc for preliminary results.   Lynne Takemoto, RVT 11/08/2020, 4:55 PM

## 2020-11-08 NOTE — Progress Notes (Signed)
Progress Note    Vincent Adams  WUJ:[id number] DOB: 03-26-1960  DOA: 10/30/2020 PCP: Hulan Fess, MD    Brief Narrative:     Medical records reviewed and are as summarized below:  Vincent Adams is an 60 y.o. male with a past medical history significant for HTN, right LLE, and hyperparathyroidism who present w/ LLE pain w/ infection and AKI.   Slow to improve.  Supect home on 10/10   Assessment/Plan:   Principal Problem:   ARF (acute renal failure) (HCC) Active Problems:   Cellulitis of right lower extremity   Acute hyponatremia   Hypokalemia   High anion gap metabolic acidosis   Hypertension   Tobacco abuse   ATN  Multifactorial: IV contrast  dehydration ,continued use of thiazide, ACE Renal consult appreciated-- they have signed off Recovering --likely will not need HD  Right lower extremity erythema and likely cellulitis -overall improved from admit [see earlier pictures] but still with marked swelling Not concerned regarding compartment syndrome -able to move LE's White count extremely elevated-likely form infeciton--no other source but trending down Continue ceftriaxone 1 g daily -r/o blood clot with duplex  HTN Was on HCTZ prior to admission in addition to quinapril  hold to allow for good renal perfusion -resume Toprol XL  Hypokalemia improved  obesity Body mass index is 38.49 kg/m.   Family Communication/Anticipated D/C date and plan/Code Status   DVT prophylaxis: heparin Code Status: Full Code.   Disposition Plan: Status is: Inpatient  Remains inpatient appropriate because:Inpatient level of care appropriate due to severity of illness  Dispo: The patient is from: Home              Anticipated d/c is to: Home              Patient currently is not medically stable to d/c. Change to PO abx and d/c home in the AM   Difficult to place patient No         Medical Consultants:   renal  Subjective:   Awaiting LE  duplex  Objective:    Vitals:   11/08/20 0000 11/08/20 0317 11/08/20 0720 11/08/20 1144  BP: 127/78 135/79 (!) 143/85 119/73  Pulse: 66 66 63 64  Resp: 18 14 17 18   Temp: 98.7 F (37.1 C) 98.8 F (37.1 C) 98.6 F (37 C) 98.5 F (36.9 C)  TempSrc: Oral Oral Oral Oral  SpO2: 96% 100% 99% 98%  Weight:      Height:        Intake/Output Summary (Last 24 hours) at 11/08/2020 1257 Last data filed at 11/08/2020 1015 Gross per 24 hour  Intake 480 ml  Output --  Net 480 ml   Filed Weights   10/31/20 2130 11/01/20 0548  Weight: 132.9 kg 128.7 kg    Exam:  In bed, NAD Rrr No increased work of breathing      Data Reviewed:   I have personally reviewed following labs and imaging studies:  Labs: Labs show the following:   Basic Metabolic Panel: Recent Labs  Lab 11/02/20 0050 11/03/20 0105 11/04/20 0114 11/05/20 0147 11/06/20 0116 11/07/20 0131 11/08/20 0144  NA 134* 138 137 139 138 137 135  K 3.8 4.2 4.3 4.6 4.6 4.6 4.6  CL 102 104 104 104 104 107 104  CO2 18* 21* 24 26 26 23 24   GLUCOSE 177* 158* 173* 120* 141* 181* 195*  BUN 126* 105* 77* 54* 39* 31* 27*  CREATININE 8.27*  5.45* 3.42* 2.69* 2.16* 1.93* 1.71*  CALCIUM 7.6* 8.0* 7.9* 8.4* 8.5* 8.4* 8.3*  PHOS 6.9* 5.6* 4.8*  --   --   --   --    GFR Estimated Creatinine Clearance: 62.9 mL/min (A) (by C-G formula based on SCr of 1.71 mg/dL (H)). Liver Function Tests: Recent Labs  Lab 11/02/20 0050 11/03/20 0105 11/04/20 0114  ALBUMIN 2.0* 2.0* 2.0*   No results for input(s): LIPASE, AMYLASE in the last 168 hours. No results for input(s): AMMONIA in the last 168 hours. Coagulation profile No results for input(s): INR, PROTIME in the last 168 hours.   CBC: Recent Labs  Lab 11/02/20 0050 11/03/20 0105 11/05/20 0147 11/06/20 0116 11/07/20 0131 11/08/20 0144  WBC 24.1* 22.6* 19.5* 16.6* 15.6* 11.1*  NEUTROABS 21.9* 20.6*  --   --   --   --   HGB 9.9* 9.8* 9.7* 9.9* 9.5* 9.3*  HCT 29.1* 29.1*  29.6* 30.8* 30.8* 29.3*  MCV 87.9 88.7 91.1 92.2 95.4 93.9  PLT 455* 455* 430* 439* 415* 401*   Cardiac Enzymes: No results for input(s): CKTOTAL, CKMB, CKMBINDEX, TROPONINI in the last 168 hours.  BNP (last 3 results) No results for input(s): PROBNP in the last 8760 hours. CBG: No results for input(s): GLUCAP in the last 168 hours. D-Dimer: No results for input(s): DDIMER in the last 72 hours. Hgb A1c: No results for input(s): HGBA1C in the last 72 hours. Lipid Profile: No results for input(s): CHOL, HDL, LDLCALC, TRIG, CHOLHDL, LDLDIRECT in the last 72 hours. Thyroid function studies: No results for input(s): TSH, T4TOTAL, T3FREE, THYROIDAB in the last 72 hours.  Invalid input(s): FREET3 Anemia work up: No results for input(s): VITAMINB12, FOLATE, FERRITIN, TIBC, IRON, RETICCTPCT in the last 72 hours.  Sepsis Labs: Recent Labs  Lab 11/05/20 0147 11/06/20 0116 11/07/20 0131 11/08/20 0144  WBC 19.5* 16.6* 15.6* 11.1*    Microbiology Recent Results (from the past 240 hour(s))  Blood culture (routine x 2)     Status: None   Collection Time: 10/30/20  3:36 PM   Specimen: BLOOD  Result Value Ref Range Status   Specimen Description BLOOD RIGHT ANTECUBITAL  Final   Special Requests   Final    BOTTLES DRAWN AEROBIC AND ANAEROBIC Blood Culture adequate volume   Culture   Final    NO GROWTH 5 DAYS Performed at [facility], [address] [place], [place] [address]    Report Status 11/04/2020 FINAL  Final  Blood culture (routine x 2)     Status: None   Collection Time: 10/30/20  4:10 PM   Specimen: BLOOD  Result Value Ref Range Status   Specimen Description BLOOD RIGHT ANTECUBITAL  Final   Special Requests   Final    BOTTLES DRAWN AEROBIC AND ANAEROBIC Blood Culture results may not be optimal due to an excessive volume of blood received in culture bottles   Culture   Final    NO GROWTH 5 DAYS Performed at [facility], [place] [address], [place], [place]  [address]    Report Status 11/04/2020 FINAL  Final  SARS CORONAVIRUS 2 (TAT 6-24 HRS) Nasopharyngeal Nasopharyngeal Swab     Status: None   Collection Time: 10/30/20  4:49 PM   Specimen: Nasopharyngeal Swab  Result Value Ref Range Status   SARS Coronavirus 2 NEGATIVE NEGATIVE Final    Comment: (NOTE) SARS-CoV-2 target nucleic acids are NOT DETECTED.  The SARS-CoV-2 RNA is generally detectable in upper and  lower respiratory specimens during the acute phase of infection. Negative results do not preclude SARS-CoV-2 infection, do not rule out co-infections with other pathogens, and should not be used as the sole basis for treatment or other patient management decisions. Negative results must be combined with clinical observations, patient history, and epidemiological information. The expected result is Negative.  Fact Sheet for Patients: SugarRoll.be  Fact Sheet for Healthcare Providers: https://www.woods-mathews.com/  This test is not yet approved or cleared by the [place] FDA and  has been authorized for detection and/or diagnosis of SARS-CoV-2 by FDA under an Emergency Use Authorization (EUA). This EUA will remain  in effect (meaning this test can be used) for the duration of the COVID-19 declaration under Se ction 564(b)(1) of the Act, 21 U.S.C. section 360bbb-3(b)(1), unless the authorization is terminated or revoked sooner.  Performed at [facility], [place] [address] [place], [place] [address]     Procedures and diagnostic studies:  No results found.  Medications:    amLODipine  10 mg Oral Daily   aspirin  325 mg Oral Daily   doxazosin  8 mg Oral Daily   doxycycline  100 mg Oral Q12H   heparin  5,000 Units Subcutaneous Q8H   hydrocerin   Topical BID   HYDROcodone-acetaminophen  1 tablet Oral Q6H   metoprolol succinate  100 mg Oral Daily   pravastatin  40 mg Oral Daily   Continuous Infusions:     LOS: 9 days    Geradine Girt  [facility]   How to contact the [facility] Attending or Consulting provider [place] or covering provider during after hours [place], for this patient?  Check the care team in [facility]) and look for a) attending/consulting TRH provider listed and b) the [facility] team listed Log into [email or web address] and use [place]'s universal password to access. If you do not have the password, please contact the hospital operator. Locate the [facility] provider you are looking for under [facility] and page to a number that you can be directly reached. If you still have difficulty reaching the provider, please page the [facility] (Director on Call) for the Hospitalists listed on amion for assistance.  11/08/2020, 12:57 PM

## 2020-11-08 NOTE — TOC Transition Note (Signed)
Transition of Care [facility]) - CM/SW Discharge Note   Patient Details  Name: Vincent Adams MRN: [id number] Date of Birth: 01/23/1961  Transition of Care [facility]) CM/SW Contact:  Verdell Carmine, RN Phone Number: 11/08/2020, 10:29 AM   Clinical Narrative:     No recommendation for [facility] services, he can borrow a walker according to friend. Patient called  to make sure he  had no further needs. No answer, messaged RN  Final next level of care: Home/Self Care Barriers to Discharge: No Barriers Identified   Patient Goals and CMS Choice Patient states their goals for this hospitalization and ongoing recovery are:: return home      Discharge Placement                       Discharge Plan and Services                                     Social Determinants of Health (SDOH) Interventions     Readmission Risk Interventions No flowsheet data found.

## 2020-11-09 ENCOUNTER — Other Ambulatory Visit (HOSPITAL_COMMUNITY): Payer: Self-pay

## 2020-11-09 DIAGNOSIS — N171 Acute kidney failure with acute cortical necrosis: Secondary | ICD-10-CM | POA: Diagnosis not present

## 2020-11-09 DIAGNOSIS — L03115 Cellulitis of right lower limb: Secondary | ICD-10-CM | POA: Diagnosis not present

## 2020-11-09 LAB — BASIC METABOLIC PANEL
Anion gap: 6 (ref 5–15)
BUN: 29 mg/dL — ABNORMAL HIGH (ref 8–23)
CO2: 23 mmol/L (ref 22–32)
Calcium: 8.5 mg/dL — ABNORMAL LOW (ref 8.9–10.3)
Chloride: 108 mmol/L (ref 98–111)
Creatinine, Ser: 1.76 mg/dL — ABNORMAL HIGH (ref 0.61–1.24)
GFR, Estimated: 43 mL/min — ABNORMAL LOW (ref >60)
Glucose, Bld: 109 mg/dL — ABNORMAL HIGH (ref 70–99)
Potassium: 5.1 mmol/L (ref 3.5–5.1)
Sodium: 137 mmol/L (ref 135–145)

## 2020-11-09 LAB — CBC
HCT: 29.9 % — ABNORMAL LOW (ref 39.0–52.0)
Hemoglobin: 9.5 g/dL — ABNORMAL LOW (ref 13.0–17.0)
MCH: 30.1 pg (ref 26.0–34.0)
MCHC: 31.8 g/dL (ref 30.0–36.0)
MCV: 94.6 fL (ref 80.0–100.0)
Platelets: 376 10*3/uL (ref 150–400)
RBC: 3.16 MIL/uL — ABNORMAL LOW (ref 4.22–5.81)
RDW: 13.6 % (ref 11.5–15.5)
WBC: 9.6 10*3/uL (ref 4.0–10.5)
nRBC: 0 % (ref 0.0–0.2)

## 2020-11-09 MED ORDER — DOXYCYCLINE HYCLATE 100 MG PO TABS
100.0000 mg | ORAL_TABLET | Freq: Two times a day (BID) | ORAL | 0 refills | Status: AC
  Filled 2020-11-09: qty 8, 4d supply, fill #0

## 2020-11-09 MED ORDER — HYDROCERIN EX CREA: 1.0000 "application " | TOPICAL_CREAM | Freq: Two times a day (BID) | CUTANEOUS | 0 refills | Status: AC

## 2020-11-09 NOTE — Plan of Care (Signed)
  Problem: Education: Goal: Knowledge of General Education information will improve Description: Including pain rating scale, medication(s)/side effects and non-pharmacologic comfort measures Outcome: Adequate for Discharge   

## 2020-11-09 NOTE — Progress Notes (Signed)
Discharge instructions (including medications) discussed with and copy provided to patient/caregiver 

## 2020-11-09 NOTE — Discharge Summary (Signed)
Physician Discharge Summary  Lyonel Morejon Mandley LEX:[id number] DOB: [birth year] DOA: 10/30/2020  PCP: Hulan Fess, MD  Admit date: 10/30/2020 Discharge date: 11/09/2020  Admitted From: home Discharge disposition: home   Recommendations for Outpatient Follow-Up:   BMP 1 week re: Cr Monitor wound and swelling in LE- ? Compression wrapping once healed   Discharge Diagnosis:   Principal Problem:   ARF (acute renal failure) (HCC) Active Problems:   Cellulitis of right lower extremity   Acute hyponatremia   Hypokalemia   High anion gap metabolic acidosis   Hypertension   Tobacco abuse    Discharge Condition: Improved.  Diet recommendation: Low sodium, heart healthy Wound care: Cleanse the BLE with soap and water, pat dry then apply Eucerin cream to the area. Apply twice daily  Code status: Full.   History of Present Illness:   Vincent Adams is a 60 y.o. male with medical history significant for essential hypertension, chronic tobacco abuse, who is admitted to [facility] on 10/30/2020 with acute renal failure after presenting from home to [facility] ED complaining of right lower extremity erythema.    The patient had originally presented to [facility] emergency department on 10/25/2020 complaining of approximately 1 week of progressive r Dahlstedt/flood warning ight lower extremity erythema associated with tenderness, increased warmth to touch, swelling, in a distribution associated with the anterior aspect of the right lower extremity distal to the right knee.  At that time, he was diagnosed with right lower extremity cellulitis and started on Keflex 500 mg p.o. every 6 hours.  Work-up at that time included right lower extremity venous ultrasound which demonstrated no evidence of acute DVT.  Of note, labs performed on 10/25/2020 were notable for serum sodium 130, creatinine 1.77, with CBC notable for white blood cell count of 16,000.   Patient subsequently discharged home  from the ED on 10/25/2020 and reported good interval compliance on the aforementioned Keflex.  However, he presents back to [facility] ED today for further evaluation of interval worsening of right lower extremity erythema, tenderness, increased warmth, swelling, with the presence of serous drainage.  Patient denies any overt associated purulent drainage.  Denies any recent preceding trauma involving the right lower extremity, and is not aware of any overt preceding breaks in the skin integrity associated with the RLE.  Over the last 5 days, the patient reports decline in appetite resulting in significant reduction in his daily consumption of both food and water.  Denies any associated nausea or vomiting.  Over that timeframe, he notes an average of 1 daily episode of loose stool in the absence of any melena or hematochezia.  Not associate with any abdominal discomfort.  Denies associated subjective fever, chills, rigors, or generalized myalgias.  Denies any numbness or paresthesias associated with the RLE, and denies any  additional rash in any other location, and also denies any acute oral lesions, nor any involvement of the palms/soles.  Denies any known history of underlying diabetes.   Medical history notable for hypertension, with outpatient hypertensive regimen including quinapril.  Denies any recent use of NSAIDs.  Denies any recent dysuria, gross hematuria, or change in urinary urgency/frequency.  No documented history of chronic kidney disease, and denies any history of prior dialysis.    Denies any recent headache, neck stiffness, rhinitis, rhinorrhea, sore throat, sob, wheezing, cough.  No recent known COVID-19 exposures.  Denies any recent chest pain, diaphoresis, palpitations, dizziness, presyncope, or syncope.  No recent orthopnea  or PND.     Hospital Course by Problem:   ATN  Multifactorial: IV contrast  dehydration ,continued use of thiazide, ACE Renal consult appreciated-- they have  signed off Recovering --likely will not need HD   Right lower extremity erythema and likely cellulitis -overall improved from admit [see earlier pictures] but still with marked swelling Not concerned regarding compartment syndrome -able to move LE's White count extremely elevated-likely form infeciton--no other source but trending down Change to PO abx and finish course -ruled out blood clot with duplex   HTN Resume home meds   Hypokalemia improved   obesity Body mass index is 38.49 kg/m.      Medical Consultants:   renal   Discharge Exam:   Vitals:   11/09/20 0519 11/09/20 0735  BP: 136/86 (!) 151/83  Pulse: 65 61  Resp: 16 17  Temp: 98.9 F (37.2 C) 98.9 F (37.2 C)  SpO2: 96% 97%   Vitals:   11/08/20 2013 11/09/20 0014 11/09/20 0519 11/09/20 0735  BP: 126/80 (!) 141/87 136/86 (!) 151/83  Pulse: 75 63 65 61  Resp: 16 16 16 17   Temp: 98.9 F (37.2 C) 99 F (37.2 C) 98.9 F (37.2 C) 98.9 F (37.2 C)  TempSrc: Oral Oral Oral Oral  SpO2: 96% 92% 96% 97%  Weight:      Height:        General exam: Appears calm and comfortable.    The results of significant diagnostics from this hospitalization (including imaging, microbiology, ancillary and laboratory) are listed below for reference.     Procedures and Diagnostic Studies:   US RENAL  Result Date: 10/30/2020 CLINICAL DATA:  Elevated creatinine. EXAM: RENAL / URINARY TRACT ULTRASOUND COMPLETE COMPARISON:  CT angiogram chest 10/25/2020. FINDINGS: Right Kidney: Renal measurements: 14.3 x 6.7 x 7.2 cm = volume: 360 mL. Echogenicity within normal limits. No hydronephrosis. There are 2 cysts identified in the right kidney. One measures 3.7 x 3.5 x 2.4 cm. The other measures 2.6 x 2.9 x 2.4 cm. Left Kidney: Renal measurements: 14.9 x 7.5 x 7.1 cm = volume: 413 mL. Echogenicity within normal limits. There is no hydronephrosis. There are 2 cysts identified in the left kidney. One measures 2.7 x 3.0 x 2.5 cm. The  other measures 4.2 x 3.7 x 3.2 cm. Bladder: Under distended and not well evaluated. Other: None. IMPRESSION: 1. No hydronephrosis.  No acute abnormality. 2. Bilateral renal cysts. 3. Bladder not well evaluated. Electronically Signed   By: Ronney Asters M.D.   On: 10/30/2020 17:08   DG Chest Portable 1 View  Result Date: 10/30/2020 CLINICAL DATA:  Leg swelling EXAM: PORTABLE CHEST 1 VIEW COMPARISON:  10/25/2020 FINDINGS: Single frontal view of the chest demonstrates mild enlargement the cardiac silhouette unchanged. No airspace disease, effusion, or pneumothorax. No acute bony abnormalities. IMPRESSION: 1. No acute intrathoracic process. Electronically Signed   By: Randa Ngo M.D.   On: 10/30/2020 18:07   [place] RT LOWER EXTREM LTD SOFT TISSUE NON VASCULAR  Result Date: 10/30/2020 CLINICAL DATA:  Right leg pain and swelling, evaluate for possible abscess EXAM: ULTRASOUND RIGHT LOWER EXTREMITY LIMITED TECHNIQUE: Ultrasound examination of the lower extremity soft tissues was performed in the area of clinical concern. COMPARISON:  None. FINDINGS: Scanning in the area of clinical concern reveals generalized subcutaneous edema. No focal fluid collection is identified to suggest focal abscess. IMPRESSION: Generalized right lower extremity edema without focal fluid collection. Electronically Signed   By: Linus Mako.D.  On: 10/30/2020 20:54     Labs:   Basic Metabolic Panel: Recent Labs  Lab 11/03/20 0105 11/04/20 0114 11/05/20 0147 11/06/20 0116 11/07/20 0131 11/08/20 0144 11/09/20 0026  NA 138 137 139 138 137 135 137  K 4.2 4.3 4.6 4.6 4.6 4.6 5.1  CL 104 104 104 104 107 104 108  CO2 21* 24 26 26 23 24 23   GLUCOSE 158* 173* 120* 141* 181* 195* 109*  BUN 105* 77* 54* 39* 31* 27* 29*  CREATININE 5.45* 3.42* 2.69* 2.16* 1.93* 1.71* 1.76*  CALCIUM 8.0* 7.9* 8.4* 8.5* 8.4* 8.3* 8.5*  PHOS 5.6* 4.8*  --   --   --   --   --    GFR Estimated Creatinine Clearance: 61.1 mL/min (A) (by C-G  formula based on SCr of 1.76 mg/dL (H)). Liver Function Tests: Recent Labs  Lab 11/03/20 0105 11/04/20 0114  ALBUMIN 2.0* 2.0*   No results for input(s): LIPASE, AMYLASE in the last 168 hours. No results for input(s): AMMONIA in the last 168 hours. Coagulation profile No results for input(s): INR, PROTIME in the last 168 hours.  CBC: Recent Labs  Lab 11/03/20 0105 11/05/20 0147 11/06/20 0116 11/07/20 0131 11/08/20 0144 11/09/20 0026  WBC 22.6* 19.5* 16.6* 15.6* 11.1* 9.6  NEUTROABS 20.6*  --   --   --   --   --   HGB 9.8* 9.7* 9.9* 9.5* 9.3* 9.5*  HCT 29.1* 29.6* 30.8* 30.8* 29.3* 29.9*  MCV 88.7 91.1 92.2 95.4 93.9 94.6  PLT 455* 430* 439* 415* 401* 376   Cardiac Enzymes: No results for input(s): CKTOTAL, CKMB, CKMBINDEX, TROPONINI in the last 168 hours. BNP: Invalid input(s): POCBNP CBG: No results for input(s): GLUCAP in the last 168 hours. D-Dimer No results for input(s): DDIMER in the last 72 hours. Hgb A1c No results for input(s): HGBA1C in the last 72 hours. Lipid Profile No results for input(s): CHOL, HDL, LDLCALC, TRIG, CHOLHDL, LDLDIRECT in the last 72 hours. Thyroid function studies No results for input(s): TSH, T4TOTAL, T3FREE, THYROIDAB in the last 72 hours.  Invalid input(s): FREET3 Anemia work up No results for input(s): VITAMINB12, FOLATE, FERRITIN, TIBC, IRON, RETICCTPCT in the last 72 hours. Microbiology Recent Results (from the past 240 hour(s))  Blood culture (routine x 2)     Status: None   Collection Time: 10/30/20  3:36 PM   Specimen: BLOOD  Result Value Ref Range Status   Specimen Description BLOOD RIGHT ANTECUBITAL  Final   Special Requests   Final    BOTTLES DRAWN AEROBIC AND ANAEROBIC Blood Culture adequate volume   Culture   Final    NO GROWTH 5 DAYS Performed at [facility], [address], [place], [place] [address]    Report Status 11/04/2020 FINAL  Final  Blood culture (routine x 2)     Status: None   Collection  Time: 10/30/20  4:10 PM   Specimen: BLOOD  Result Value Ref Range Status   Specimen Description BLOOD RIGHT ANTECUBITAL  Final   Special Requests   Final    BOTTLES DRAWN AEROBIC AND ANAEROBIC Blood Culture results may not be optimal due to an excessive volume of blood received in culture bottles   Culture   Final    NO GROWTH 5 DAYS Performed at [facility], [place] [address] [place], [place] [address]    Report Status 11/04/2020 FINAL  Final  SARS CORONAVIRUS 2 (TAT 6-24 HRS) Nasopharyngeal Nasopharyngeal Swab  Status: None   Collection Time: 10/30/20  4:49 PM   Specimen: Nasopharyngeal Swab  Result Value Ref Range Status   SARS Coronavirus 2 NEGATIVE NEGATIVE Final    Comment: (NOTE) SARS-CoV-2 target nucleic acids are NOT DETECTED.  The SARS-CoV-2 RNA is generally detectable in upper and lower respiratory specimens during the acute phase of infection. Negative results do not preclude SARS-CoV-2 infection, do not rule out co-infections with other pathogens, and should not be used as the sole basis for treatment or other patient management decisions. Negative results must be combined with clinical observations, patient history, and epidemiological information. The expected result is Negative.  Fact Sheet for Patients: SugarRoll.be  Fact Sheet for Healthcare Providers: https://www.woods-mathews.com/  This test is not yet approved or cleared by the [place] FDA and  has been authorized for detection and/or diagnosis of SARS-CoV-2 by FDA under an Emergency Use Authorization (EUA). This EUA will remain  in effect (meaning this test can be used) for the duration of the COVID-19 declaration under Se ction 564(b)(1) of the Act, 21 U.S.C. section 360bbb-3(b)(1), unless the authorization is terminated or revoked sooner.  Performed at [facility], [place] [address], [place], [place] [address]      Discharge Instructions:    Discharge Instructions     Diet - low sodium heart healthy   Complete by: As directed    Discharge instructions   Complete by: As directed    Elevate extremity BMP 1 week   Discharge wound care:   Complete by: As directed    Cleanse the BLE with soap and water, pat dry then apply Eucerin cream and leave open to air.   Increase activity slowly   Complete by: As directed       Allergies as of 11/09/2020   No Known Allergies      Medication List     STOP taking these medications    cephALEXin 500 MG capsule Commonly known as: KEFLEX   cloNIDine 0.1 MG tablet Commonly known as: CATAPRES   quinapril 40 MG tablet Commonly known as: ACCUPRIL       TAKE these medications    amLODipine 10 MG tablet Commonly known as: NORVASC Take 10 mg by mouth daily.   aspirin 325 MG tablet Take 325 mg by mouth daily.   doxazosin 8 MG tablet Commonly known as: CARDURA Take 8 mg by mouth daily.   doxycycline 100 MG tablet Commonly known as: VIBRA-TABS Take 1 tablet (100 mg total) by mouth every 12 (twelve) hours.   hydrocerin Crea Apply 1 application topically 2 (two) times daily.   hydrochlorothiazide 25 MG tablet Commonly known as: HYDRODIURIL Take 25 mg by mouth daily.   HYDROcodone-acetaminophen 5-325 MG tablet Commonly known as: NORCO/VICODIN Take 1-2 tablets by mouth every 4 (four) hours as needed for moderate pain.   Klor-Con M10 10 MEQ tablet Generic drug: potassium chloride Take 10 mEq by mouth daily.   metoprolol succinate 100 MG 24 hr tablet Commonly known as: TOPROL-XL Take 100 mg by mouth daily.   multivitamin with minerals Tabs tablet Take 1 tablet by mouth daily.   pravastatin 40 MG tablet Commonly known as: PRAVACHOL Take 40 mg by mouth daily.   PROSTATE PO Take 1 capsule by mouth daily.   Tylenol 8 Hour Arthritis Pain 650 MG CR tablet Generic drug: acetaminophen Take 650 mg by mouth daily.   ZINC PO Take 1 tablet by mouth daily.  Discharge Care Instructions  (From admission, onward)           Start     Ordered   11/09/20 0000  Discharge wound care:       Comments: Cleanse the BLE with soap and water, pat dry then apply Eucerin cream and leave open to air.   11/09/20 1101            Follow-up Information     Little, Lennette Bihari, MD Follow up in 1 week(s).   Specialty: Family Medicine Why: BMP re: Raliegh Ip and Cr Contact information: [place] [phone] [phone]                  Time coordinating discharge: 35 min  Signed:  Geradine Girt DO  [facility] 11/09/2020, 11:02 AM

## 2021-01-28 ENCOUNTER — Other Ambulatory Visit: Payer: Self-pay | Admitting: Family Medicine

## 2021-01-28 DIAGNOSIS — R7401 Elevation of levels of liver transaminase levels: Secondary | ICD-10-CM

## 2021-02-04 ENCOUNTER — Ambulatory Visit
Admission: RE | Admit: 2021-02-04 | Discharge: 2021-02-04 | Disposition: A | Discharge status: Home / Self Care | Payer: Managed Care, Other (non HMO) | Source: Ambulatory Visit | Attending: Family Medicine | Admitting: Family Medicine

## 2021-02-04 DIAGNOSIS — R7401 Elevation of levels of liver transaminase levels: Secondary | ICD-10-CM

## 2022-10-05 IMAGING — CT CT ANGIO CHEST
2 of 7 series · 17 of 46 positions shown · IV contrast (APPLIED)
Comparison: Chest x-ray 11/18/2005, CT chest 11/18/2005, NM
parathyroid scintigraphy 12/17/2015

CLINICAL DATA: Pt reports increased pain and swelling to RLE x 2
days. Denies chest pain or SOB. PE suspected, high prob

EXAM:
CT ANGIOGRAPHY CHEST WITH CONTRAST
TECHNIQUE: Multidetector CT imaging of the chest was performed using the
standard protocol during bolus administration of intravenous
contrast. Multiplanar CT image reconstructions and MIPs were
obtained to evaluate the vascular anatomy.
CONTRAST:  100mL OMNIPAQUE IOHEXOL 350 MG/ML SOLN

[Series 6: thins · axial · 0.88mm/px · z∈[-709,-440]mm · 14 of 435 slices shown]
[im 25/435  lung]
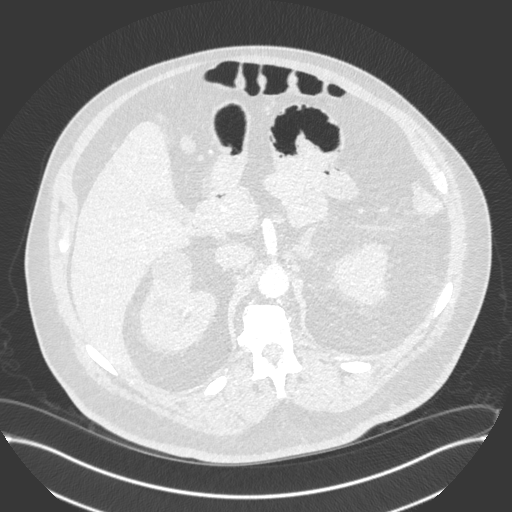
[im 49/435  soft-tissue]
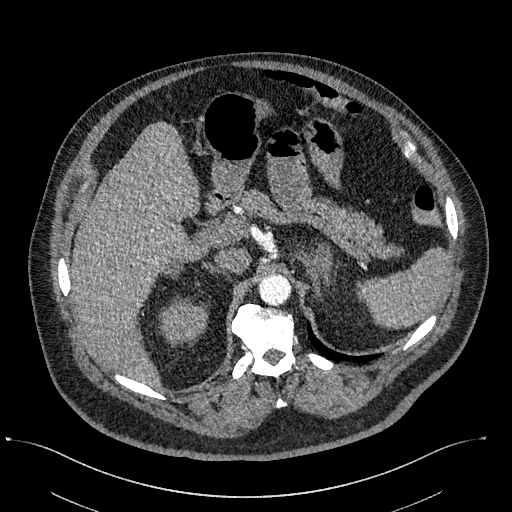
[im 97/435  lung]
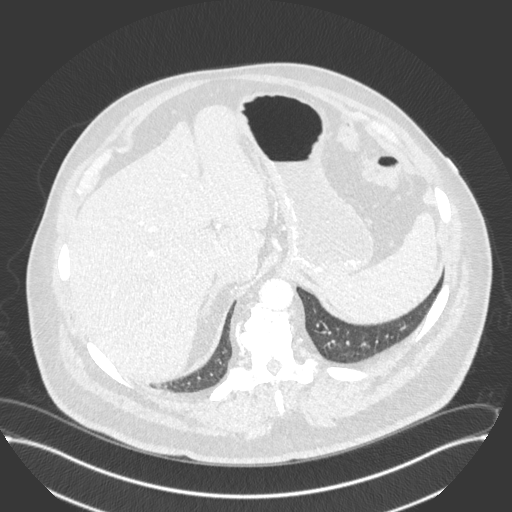
[im 121/435  soft-tissue]
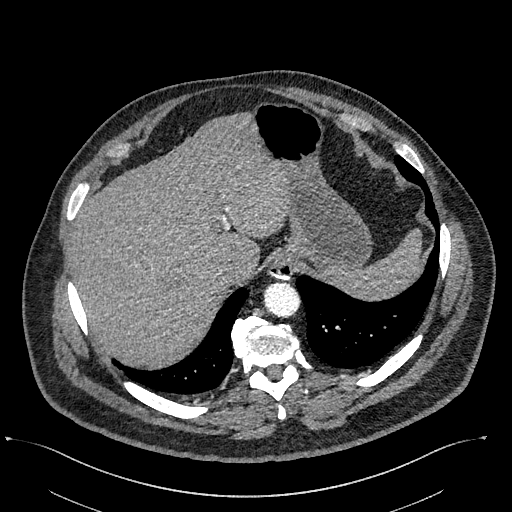
[im 145/435  lung]
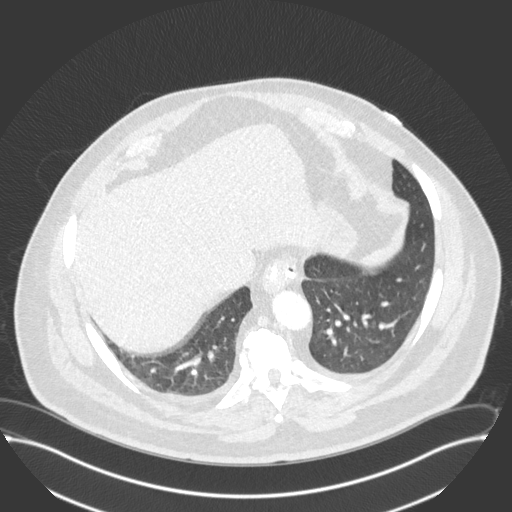
[im 169/435  soft-tissue]
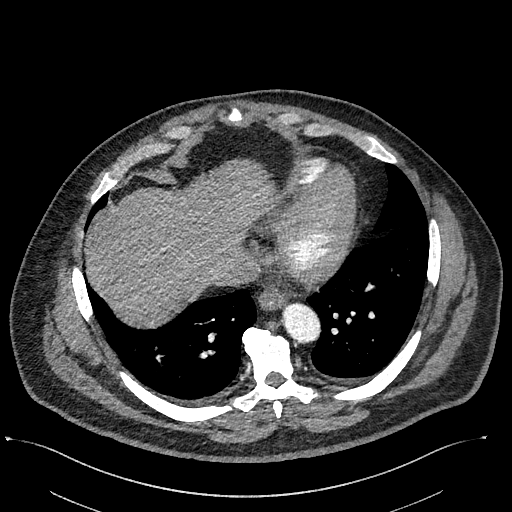
[im 193/435  lung]
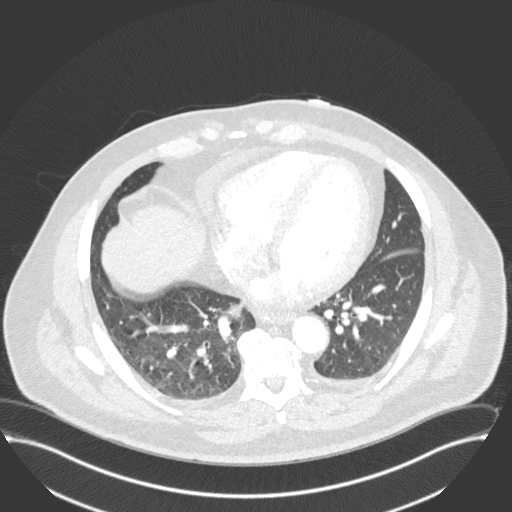
[im 242/435  soft-tissue]
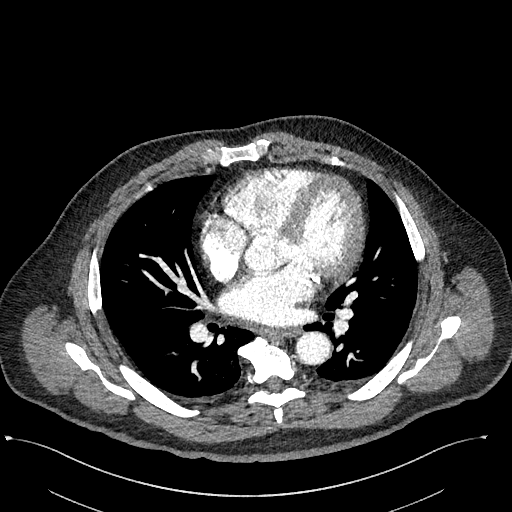
[im 266/435  lung]
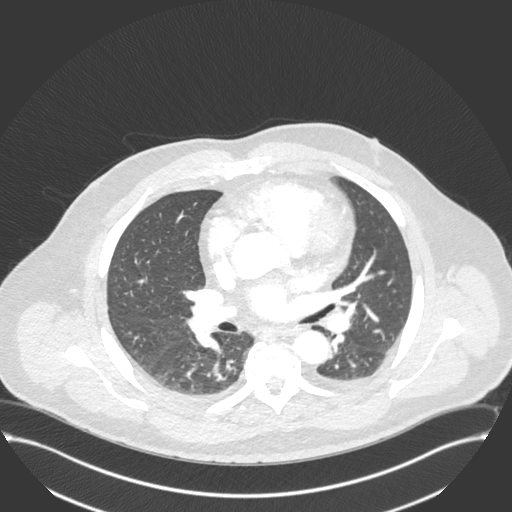
[im 290/435  soft-tissue]
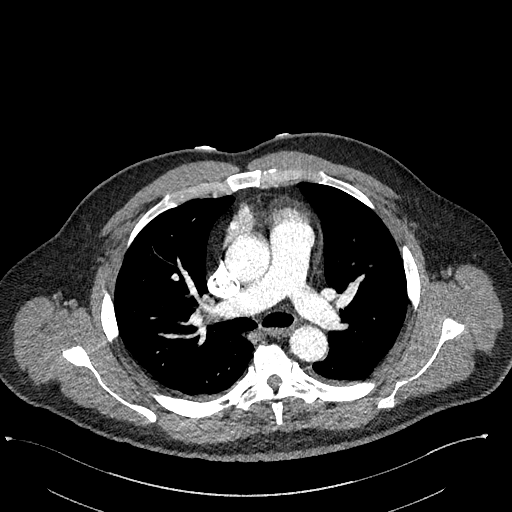
[im 314/435  lung]
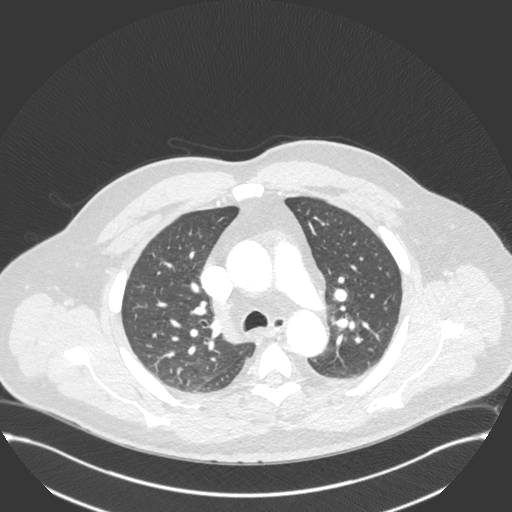
[im 338/435  soft-tissue]
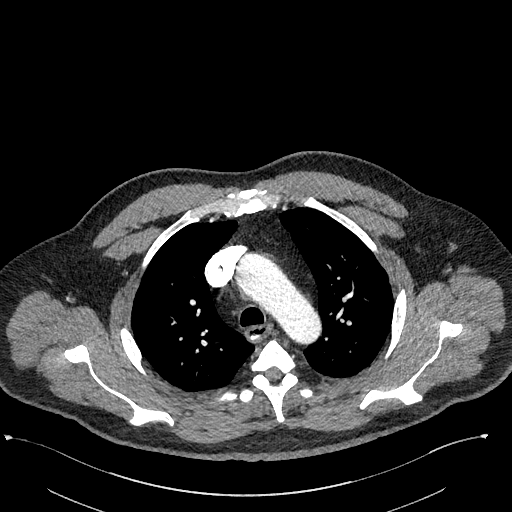
[im 386/435  lung]
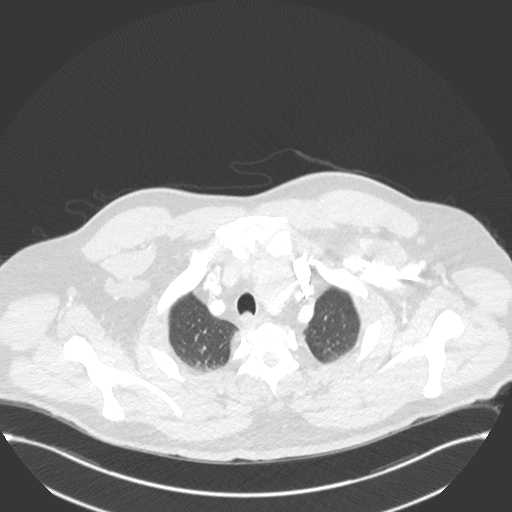
[im 410/435  soft-tissue]
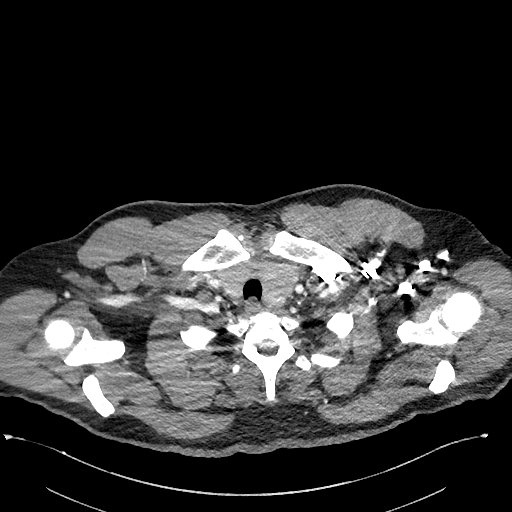

[Series 8: cor · coronal · 0.65mm/px · 3 of 164 slices shown]
[im 41/164  soft-tissue]
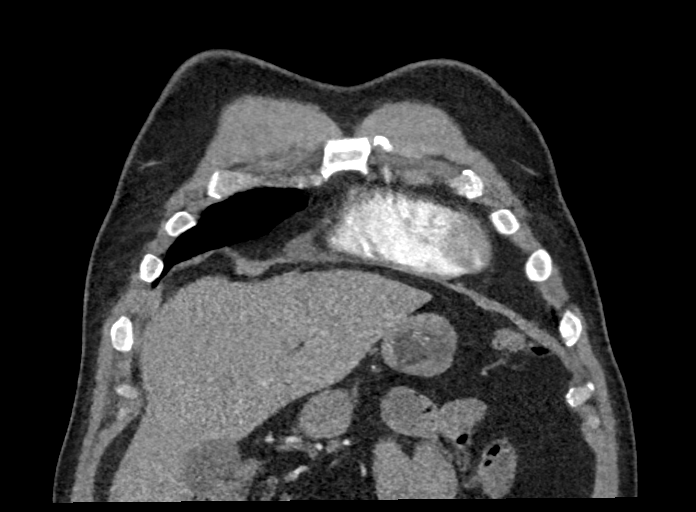
[im 82/164  soft-tissue]
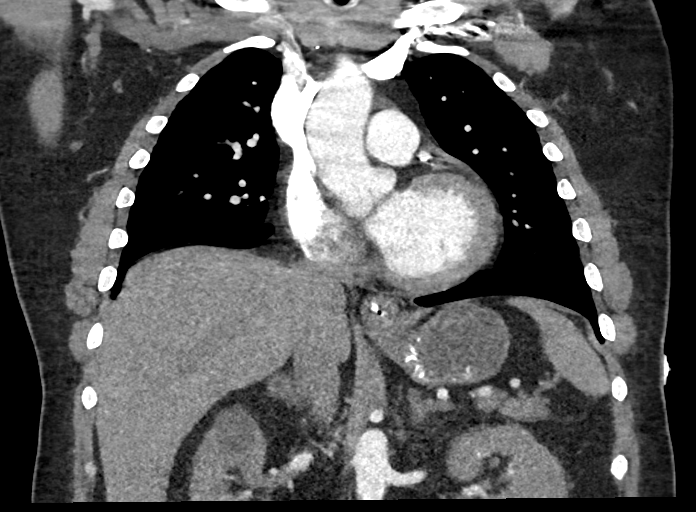
[im 123/164  soft-tissue]
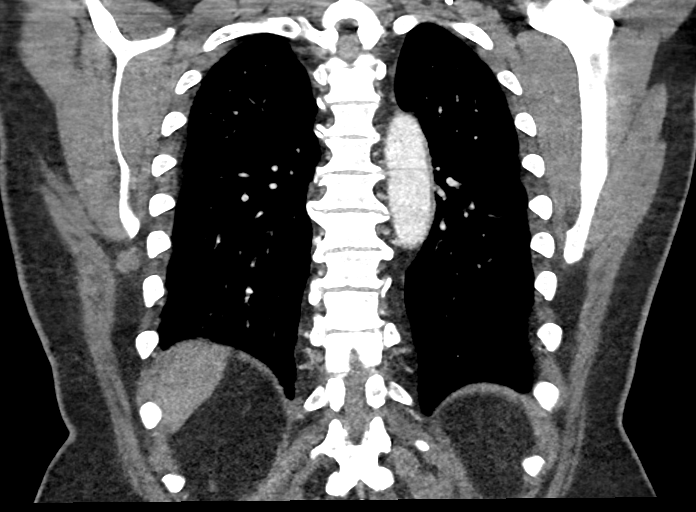

[17 of 46 positions shown; findings below may reference images not displayed]

FINDINGS: Cardiovascular: Satisfactory opacification of the pulmonary arteries
to the segmental level. No evidence of pulmonary embolism. The main
pulmonary artery measures at the upper limits of normal. Normal
heart size. No significant pericardial effusion. The thoracic aorta
is normal in caliber. No atherosclerotic plaque of the thoracic
aorta. At least 2 vessel coronary artery calcifications.

Mediastinum/Nodes: No enlarged mediastinal, hilar, or axillary lymph
nodes. Incidentally noted asymmetrically enlarged left thyroid gland
compared to the right. Not clinically significant; no follow-up
imaging recommended (ref: [HOSPITAL]. [DATE]): 143-50).
Trachea and esophagus demonstrate no significant findings. Tiny
hiatal hernia.

Lungs/Pleura: Bilateral lower lobe subsegmental atelectasis. No
focal consolidation. No pulmonary nodule. No pulmonary mass. No
pleural effusion. No pneumothorax.

Upper Abdomen: Fluid density lesions within the right kidney likely
represent simple renal cysts. Similar findings of the left kidney
partially visualized. Subcentimeter hypodensities within the liver
too small to characterize.

Musculoskeletal:

No chest wall abnormality.

No suspicious lytic or blastic osseous lesions. No acute displaced
fracture. Multilevel degenerative changes of the spine.

Review of the MIP images confirms the above findings.
IMPRESSION: 1. No pulmonary embolus.
2. The main pulmonary artery measures at the upper limits of normal.
Findings may represent pulmonary hypertension.
3. Tiny hiatal hernia.
4. At least two vessel coronary artery calcifications.
5. Cystic lesions of kidneys likely representing simple renal cysts.
Subcentimeter hypodensities within the liver that are too small to
characterize.

## 2022-10-05 IMAGING — CR DG CHEST 2V
2 series · 2 of 2 positions shown · non-contrast
Comparison: November 18, 2005

CLINICAL DATA: sob

EXAM:
CHEST - 2 VIEW

[chest pa]
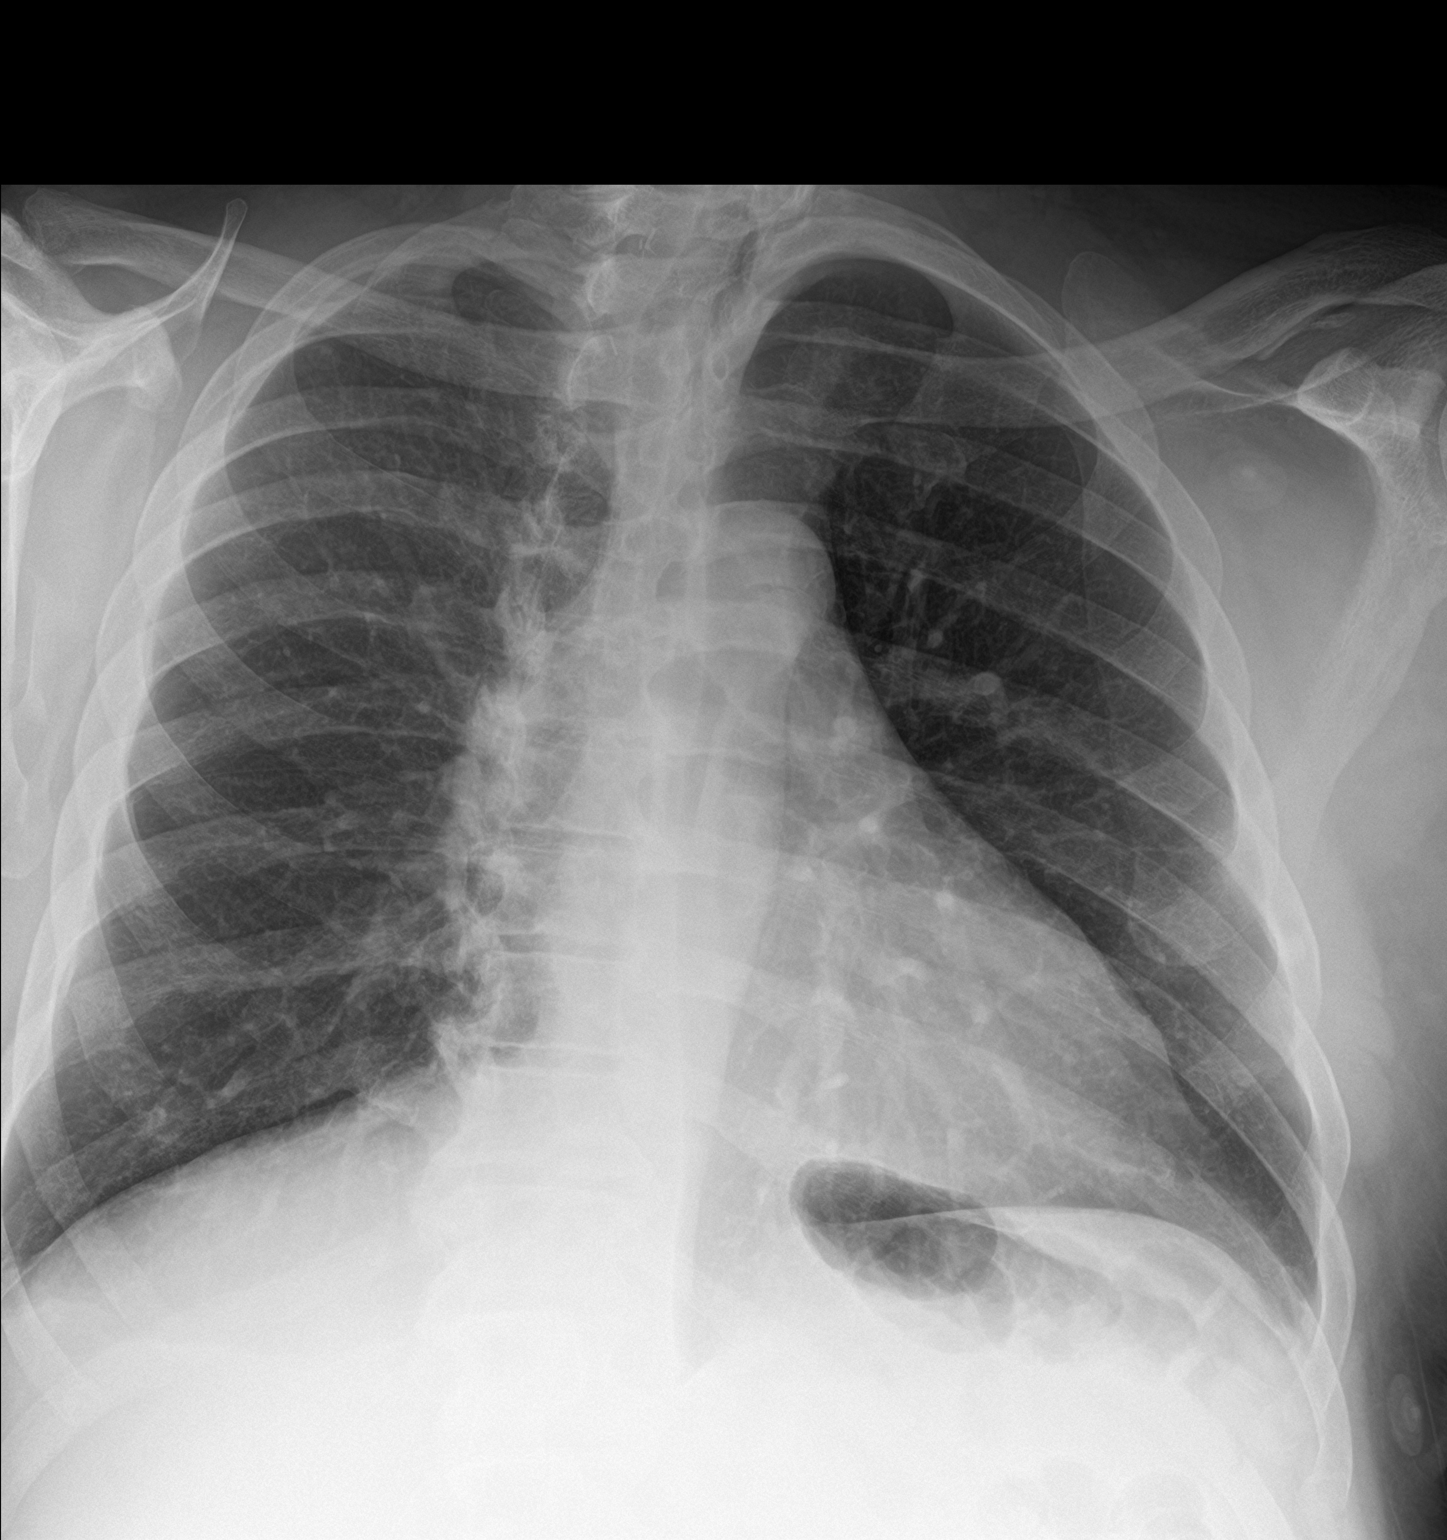

[chest lat]
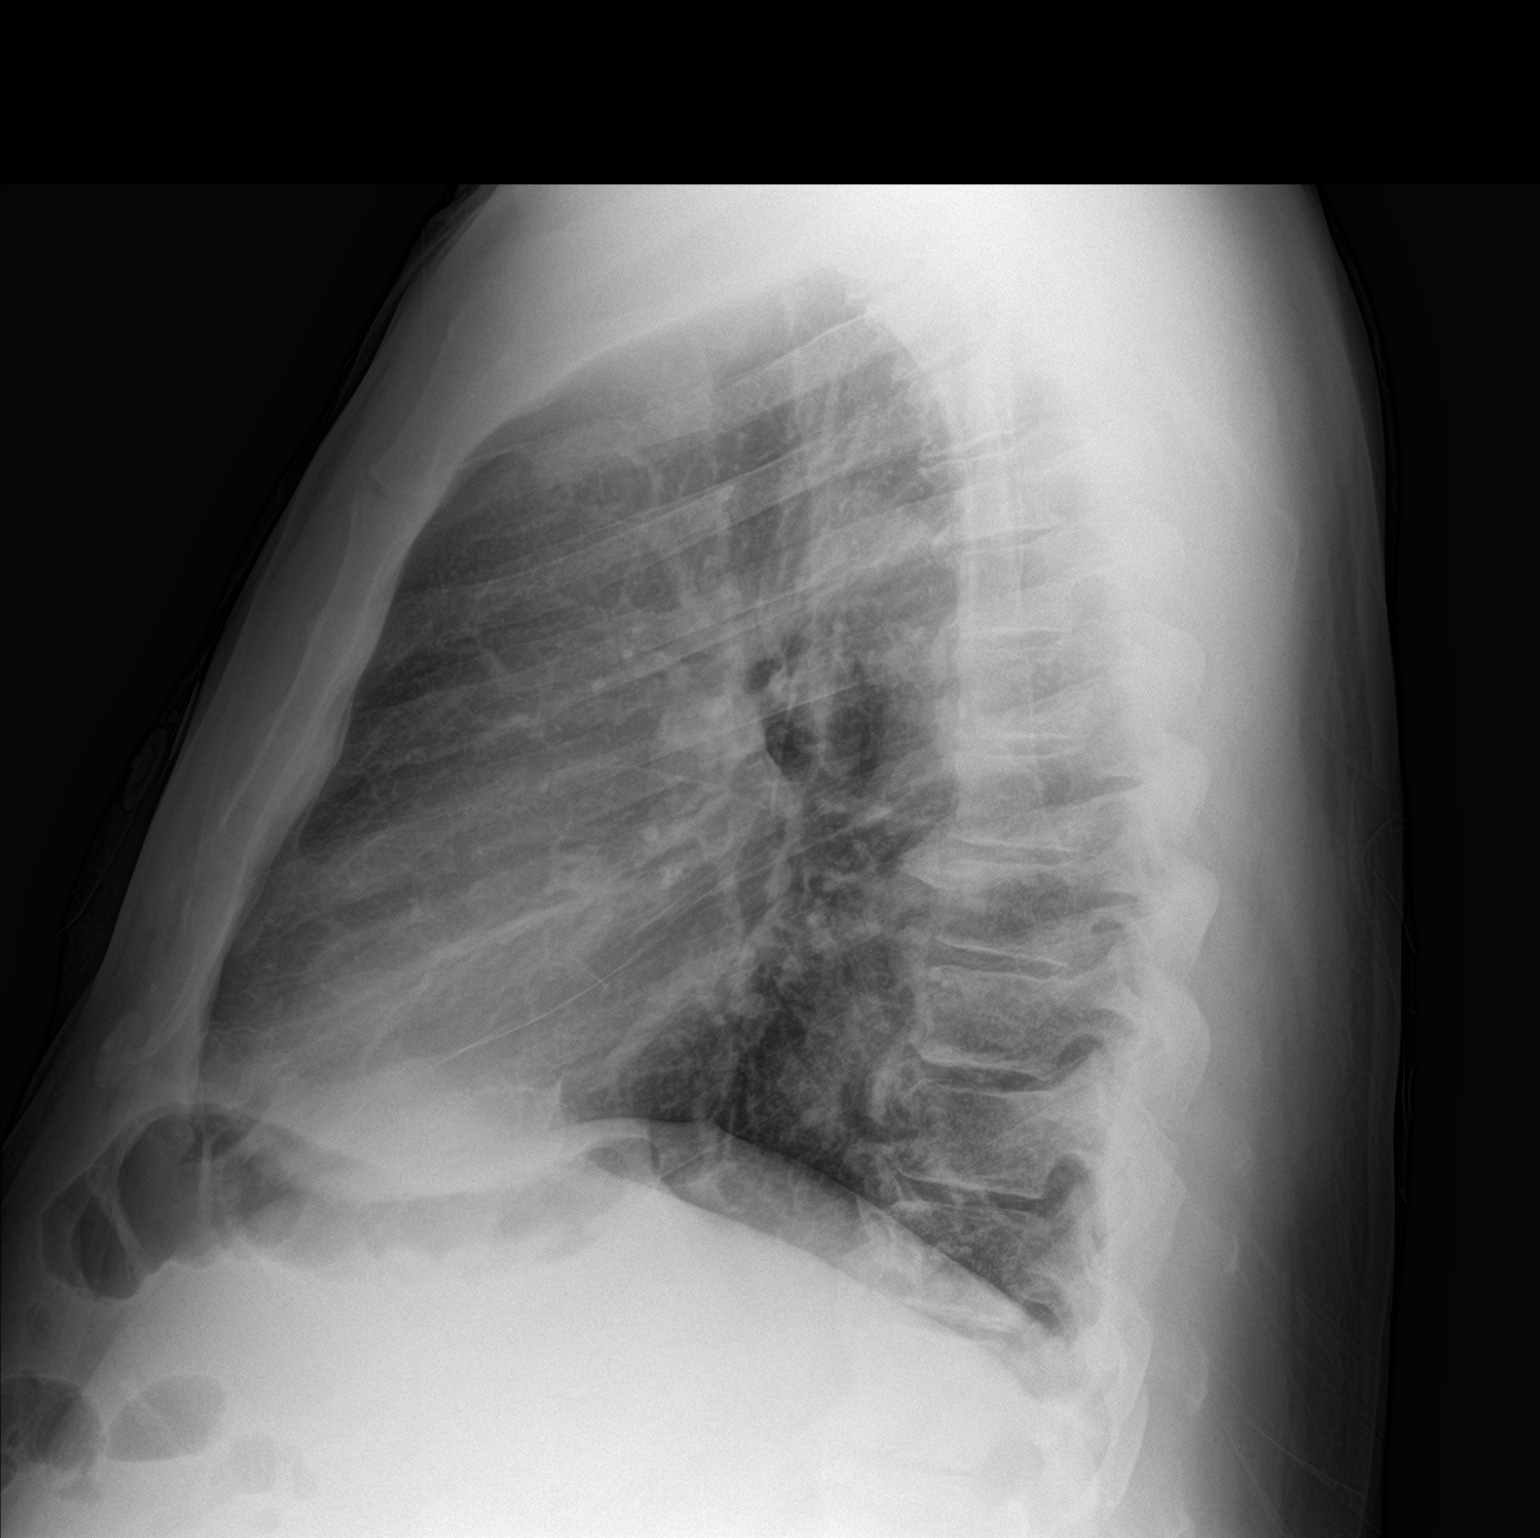

[2 of 2 positions shown; findings below may reference images not displayed]

FINDINGS: Evaluation is limited secondary to patient rotation. The
cardiomediastinal silhouette is unchanged in contour. No pleural
effusion. No pneumothorax. No acute pleuroparenchymal abnormality.
Visualized abdomen is unremarkable. Multilevel degenerative changes
of the thoracic spine.
IMPRESSION: No acute cardiopulmonary abnormality.

## 2022-10-10 IMAGING — DX DG CHEST 1V PORT
1 series · 1 of 1 positions shown · non-contrast
Comparison: 10/25/2020

CLINICAL DATA: Leg swelling

EXAM:
PORTABLE CHEST 1 VIEW

[chest]
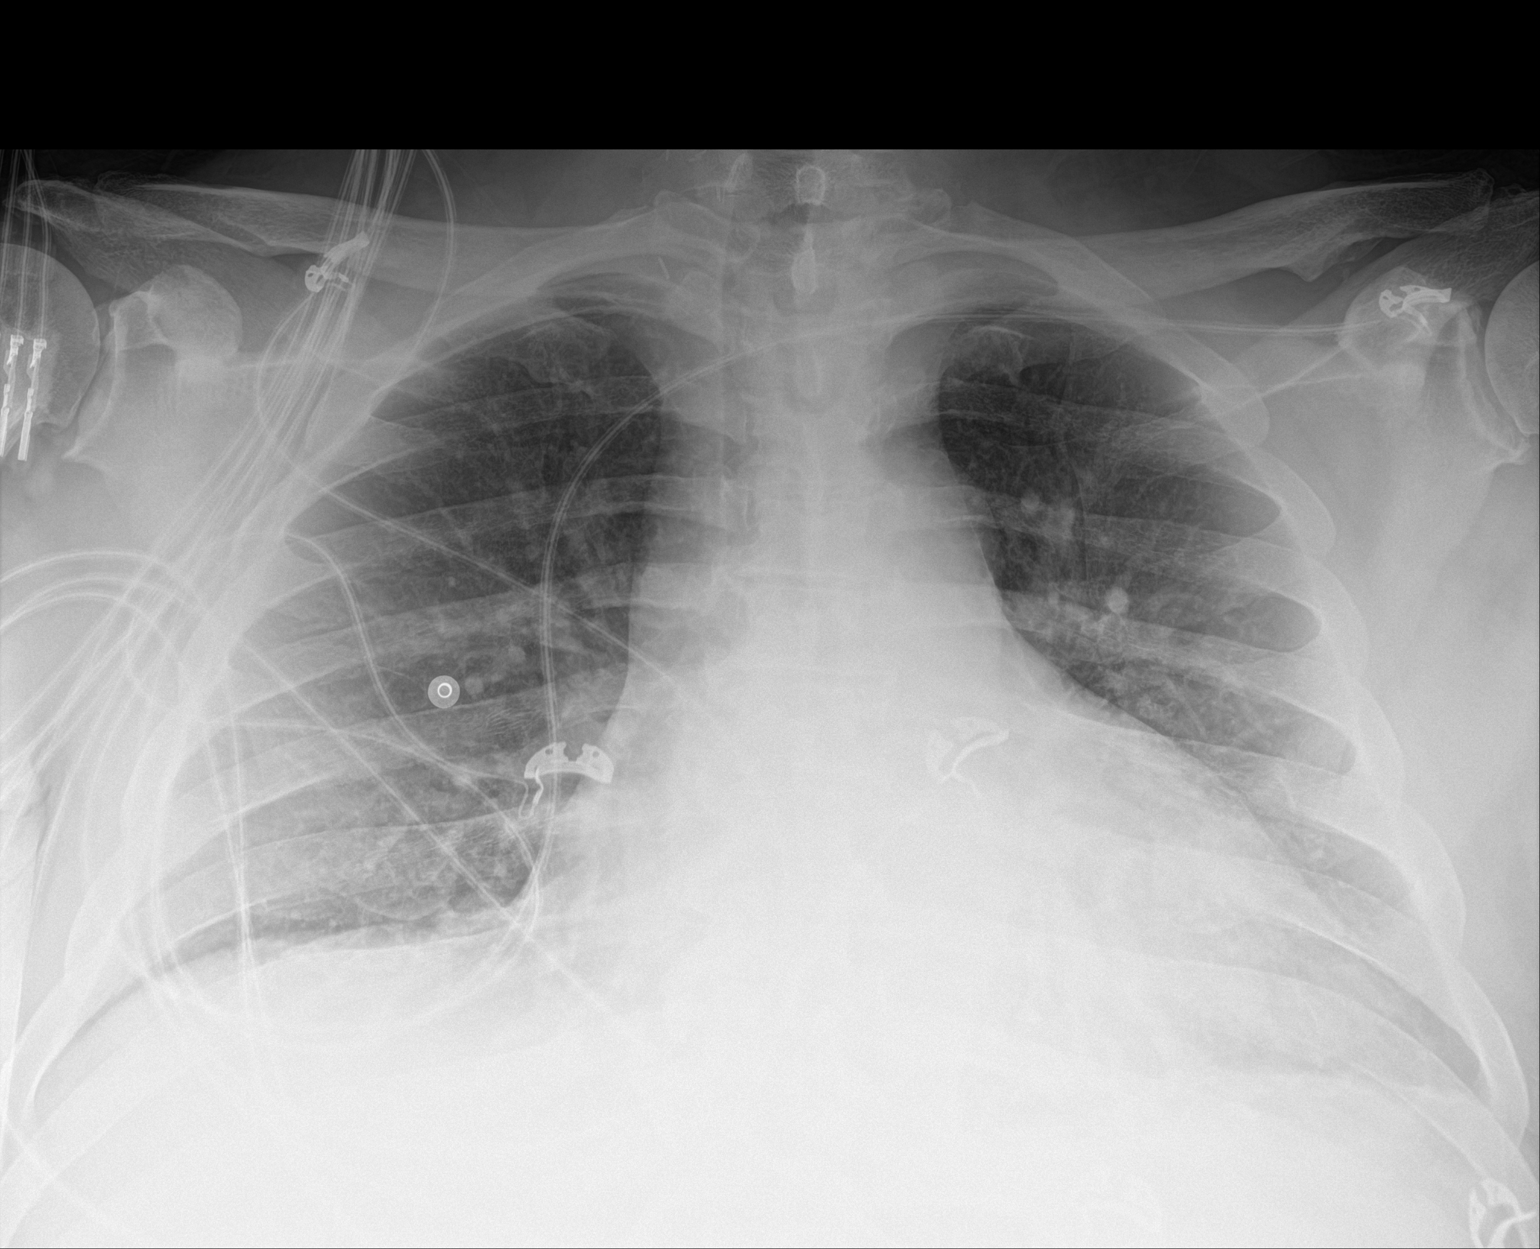

[1 of 1 positions shown; findings below may reference images not displayed]

FINDINGS: Single frontal view of the chest demonstrates mild enlargement the
cardiac silhouette unchanged. No airspace disease, effusion, or
pneumothorax. No acute bony abnormalities.
IMPRESSION: 1. No acute intrathoracic process.
